# Patient Record
Sex: Female | Born: 1940 | Race: White | Hispanic: No | Marital: Married | Smoking: Former smoker
Health system: Southern US, Community
[De-identification: ages and names within clinical notes are randomized; demographics above are authoritative.]

## PROBLEM LIST (undated history)

## (undated) DIAGNOSIS — I4819 Other persistent atrial fibrillation: Secondary | ICD-10-CM

## (undated) DIAGNOSIS — I517 Cardiomegaly: Secondary | ICD-10-CM

## (undated) DIAGNOSIS — H9190 Unspecified hearing loss, unspecified ear: Secondary | ICD-10-CM

## (undated) DIAGNOSIS — L4050 Arthropathic psoriasis, unspecified: Secondary | ICD-10-CM

## (undated) DIAGNOSIS — I071 Rheumatic tricuspid insufficiency: Secondary | ICD-10-CM

## (undated) DIAGNOSIS — I340 Nonrheumatic mitral (valve) insufficiency: Secondary | ICD-10-CM

## (undated) DIAGNOSIS — M1990 Unspecified osteoarthritis, unspecified site: Secondary | ICD-10-CM

## (undated) DIAGNOSIS — L409 Psoriasis, unspecified: Secondary | ICD-10-CM

## (undated) DIAGNOSIS — G2581 Restless legs syndrome: Secondary | ICD-10-CM

## (undated) DIAGNOSIS — K219 Gastro-esophageal reflux disease without esophagitis: Secondary | ICD-10-CM

## (undated) HISTORY — DX: Psoriasis, unspecified: L40.9

## (undated) HISTORY — PX: TUBAL LIGATION: SHX77

## (undated) HISTORY — DX: Nonrheumatic mitral (valve) insufficiency: I34.0

## (undated) HISTORY — PX: EYE SURGERY: SHX253

## (undated) HISTORY — PX: OTHER SURGICAL HISTORY: SHX169

## (undated) HISTORY — DX: Cardiomegaly: I51.7

## (undated) HISTORY — DX: Other persistent atrial fibrillation: I48.19

## (undated) HISTORY — DX: Rheumatic tricuspid insufficiency: I07.1

## (undated) HISTORY — PX: CYSTOSCOPY: SUR368

---

## 2008-05-07 ENCOUNTER — Ambulatory Visit: Payer: Self-pay | Admitting: Family Medicine

## 2010-12-21 ENCOUNTER — Inpatient Hospital Stay (HOSPITAL_COMMUNITY)
Admission: EM | Admit: 2010-12-21 | Discharge: 2010-12-29 | DRG: 372 | Disposition: A | Discharge status: Home / Self Care | Payer: Medicare Other | Attending: Internal Medicine | Admitting: Internal Medicine

## 2010-12-21 ENCOUNTER — Emergency Department (HOSPITAL_COMMUNITY): Payer: Medicare Other

## 2010-12-21 DIAGNOSIS — K219 Gastro-esophageal reflux disease without esophagitis: Secondary | ICD-10-CM | POA: Diagnosis present

## 2010-12-21 DIAGNOSIS — A498 Other bacterial infections of unspecified site: Secondary | ICD-10-CM | POA: Diagnosis present

## 2010-12-21 DIAGNOSIS — L4050 Arthropathic psoriasis, unspecified: Secondary | ICD-10-CM | POA: Diagnosis present

## 2010-12-21 DIAGNOSIS — B961 Klebsiella pneumoniae [K. pneumoniae] as the cause of diseases classified elsewhere: Secondary | ICD-10-CM | POA: Diagnosis present

## 2010-12-21 DIAGNOSIS — N390 Urinary tract infection, site not specified: Secondary | ICD-10-CM | POA: Diagnosis present

## 2010-12-21 DIAGNOSIS — K589 Irritable bowel syndrome without diarrhea: Secondary | ICD-10-CM | POA: Diagnosis present

## 2010-12-21 DIAGNOSIS — E860 Dehydration: Secondary | ICD-10-CM | POA: Diagnosis present

## 2010-12-21 DIAGNOSIS — Z87891 Personal history of nicotine dependence: Secondary | ICD-10-CM

## 2010-12-21 DIAGNOSIS — A0472 Enterocolitis due to Clostridium difficile, not specified as recurrent: Principal | ICD-10-CM | POA: Diagnosis present

## 2010-12-21 DIAGNOSIS — K5100 Ulcerative (chronic) pancolitis without complications: Secondary | ICD-10-CM | POA: Diagnosis present

## 2010-12-21 DIAGNOSIS — G2581 Restless legs syndrome: Secondary | ICD-10-CM | POA: Diagnosis present

## 2010-12-21 LAB — URINALYSIS, ROUTINE W REFLEX MICROSCOPIC
Nitrite: NEGATIVE
Specific Gravity, Urine: 1.029 (ref 1.005–1.030)
pH: 6 (ref 5.0–8.0)

## 2010-12-21 LAB — COMPREHENSIVE METABOLIC PANEL
ALT: 16 U/L (ref 0–35)
AST: 21 U/L (ref 0–37)
Calcium: 8.7 mg/dL (ref 8.4–10.5)
GFR calc Af Amer: >60 mL/min (ref >60)
Glucose, Bld: 129 mg/dL — ABNORMAL HIGH (ref 70–99)
Sodium: 135 mEq/L (ref 135–145)
Total Protein: 6 g/dL (ref 6.0–8.3)

## 2010-12-21 LAB — LIPASE, BLOOD: Lipase: 19 U/L (ref 11–59)

## 2010-12-21 LAB — URINE MICROSCOPIC-ADD ON

## 2010-12-21 LAB — CBC
MCHC: 36.1 g/dL — ABNORMAL HIGH (ref 30.0–36.0)
RDW: 12.5 % (ref 11.5–15.5)

## 2010-12-21 LAB — DIFFERENTIAL
Basophils Absolute: 0 10*3/uL (ref 0.0–0.1)
Basophils Relative: 0 % (ref 0–1)
Eosinophils Absolute: 0 10*3/uL (ref 0.0–0.7)
Eosinophils Relative: 0 % (ref 0–5)
Monocytes Absolute: 1.7 10*3/uL — ABNORMAL HIGH (ref 0.1–1.0)

## 2010-12-21 MED ORDER — IOHEXOL 300 MG/ML  SOLN: 80.0000 mL | Freq: Once | INTRAMUSCULAR | Status: AC | PRN

## 2010-12-22 LAB — BASIC METABOLIC PANEL
BUN: 6 mg/dL (ref 6–23)
Calcium: 7.5 mg/dL — ABNORMAL LOW (ref 8.4–10.5)
Creatinine, Ser: 0.83 mg/dL (ref 0.4–1.2)
GFR calc non Af Amer: >60 mL/min (ref >60)
Potassium: 3.5 mEq/L (ref 3.5–5.1)

## 2010-12-22 LAB — CBC
MCH: 32 pg (ref 26.0–34.0)
MCHC: 35.1 g/dL (ref 30.0–36.0)
MCV: 91.3 fL (ref 78.0–100.0)
Platelets: 159 10*3/uL (ref 150–400)
RDW: 12.5 % (ref 11.5–15.5)
WBC: 13.2 10*3/uL — ABNORMAL HIGH (ref 4.0–10.5)

## 2010-12-22 LAB — GIARDIA/CRYPTOSPORIDIUM SCREEN(EIA)
Cryptosporidium Screen (EIA): NEGATIVE
Giardia Screen - EIA: NEGATIVE

## 2010-12-22 LAB — CLOSTRIDIUM DIFFICILE BY PCR

## 2010-12-23 LAB — CBC
MCHC: 35.7 g/dL (ref 30.0–36.0)
MCV: 91.4 fL (ref 78.0–100.0)
Platelets: 184 10*3/uL (ref 150–400)
RDW: 12.6 % (ref 11.5–15.5)
WBC: 15.6 10*3/uL — ABNORMAL HIGH (ref 4.0–10.5)

## 2010-12-23 LAB — BASIC METABOLIC PANEL
BUN: 6 mg/dL (ref 6–23)
Creatinine, Ser: 0.78 mg/dL (ref 0.4–1.2)
GFR calc non Af Amer: >60 mL/min (ref >60)
Glucose, Bld: 100 mg/dL — ABNORMAL HIGH (ref 70–99)

## 2010-12-24 LAB — CBC
Hemoglobin: 12 g/dL (ref 12.0–15.0)
MCH: 31.7 pg (ref 26.0–34.0)
MCHC: 34.7 g/dL (ref 30.0–36.0)
MCV: 91.5 fL (ref 78.0–100.0)
RBC: 3.78 MIL/uL — ABNORMAL LOW (ref 3.87–5.11)

## 2010-12-24 LAB — URINE CULTURE

## 2010-12-25 ENCOUNTER — Inpatient Hospital Stay (HOSPITAL_COMMUNITY): Payer: Medicare Other

## 2010-12-25 LAB — DIFFERENTIAL
Basophils Absolute: 0 10*3/uL (ref 0.0–0.1)
Basophils Relative: 0 % (ref 0–1)
Eosinophils Absolute: 0.1 10*3/uL (ref 0.0–0.7)
Eosinophils Relative: 1 % (ref 0–5)
Monocytes Absolute: 1.3 10*3/uL — ABNORMAL HIGH (ref 0.1–1.0)
Monocytes Relative: 10 % (ref 3–12)
Neutro Abs: 10.5 10*3/uL — ABNORMAL HIGH (ref 1.7–7.7)

## 2010-12-25 LAB — CBC
MCH: 31.5 pg (ref 26.0–34.0)
MCHC: 34.4 g/dL (ref 30.0–36.0)
Platelets: 223 10*3/uL (ref 150–400)
RDW: 12.6 % (ref 11.5–15.5)

## 2010-12-25 LAB — STOOL CULTURE

## 2010-12-25 LAB — BASIC METABOLIC PANEL
BUN: 7 mg/dL (ref 6–23)
CO2: 26 mEq/L (ref 19–32)
Calcium: 7.3 mg/dL — ABNORMAL LOW (ref 8.4–10.5)
Chloride: 109 mEq/L (ref 96–112)
Creatinine, Ser: 0.81 mg/dL (ref 0.4–1.2)
GFR calc Af Amer: >60 mL/min (ref >60)

## 2010-12-26 DIAGNOSIS — R933 Abnormal findings on diagnostic imaging of other parts of digestive tract: Secondary | ICD-10-CM

## 2010-12-26 DIAGNOSIS — R197 Diarrhea, unspecified: Secondary | ICD-10-CM

## 2010-12-26 DIAGNOSIS — A0472 Enterocolitis due to Clostridium difficile, not specified as recurrent: Secondary | ICD-10-CM

## 2010-12-26 LAB — BASIC METABOLIC PANEL
BUN: 8 mg/dL (ref 6–23)
Chloride: 111 mEq/L (ref 96–112)
GFR calc Af Amer: >60 mL/min (ref >60)
GFR calc non Af Amer: >60 mL/min (ref >60)
Potassium: 3.2 mEq/L — ABNORMAL LOW (ref 3.5–5.1)
Sodium: 138 mEq/L (ref 135–145)

## 2010-12-26 LAB — CBC
MCV: 92 fL (ref 78.0–100.0)
Platelets: 210 10*3/uL (ref 150–400)
RBC: 3.63 MIL/uL — ABNORMAL LOW (ref 3.87–5.11)
RDW: 12.7 % (ref 11.5–15.5)
WBC: 7.2 10*3/uL (ref 4.0–10.5)

## 2010-12-27 DIAGNOSIS — A0472 Enterocolitis due to Clostridium difficile, not specified as recurrent: Secondary | ICD-10-CM

## 2010-12-27 DIAGNOSIS — R197 Diarrhea, unspecified: Secondary | ICD-10-CM

## 2010-12-27 DIAGNOSIS — R933 Abnormal findings on diagnostic imaging of other parts of digestive tract: Secondary | ICD-10-CM

## 2010-12-27 LAB — BASIC METABOLIC PANEL
BUN: 6 mg/dL (ref 6–23)
GFR calc Af Amer: >60 mL/min (ref >60)
GFR calc non Af Amer: >60 mL/min (ref >60)
Glucose, Bld: 91 mg/dL (ref 70–99)
Potassium: 3.9 mEq/L (ref 3.5–5.1)
Sodium: 140 mEq/L (ref 135–145)

## 2010-12-28 ENCOUNTER — Other Ambulatory Visit: Payer: Self-pay | Admitting: Gastroenterology

## 2010-12-28 DIAGNOSIS — A0472 Enterocolitis due to Clostridium difficile, not specified as recurrent: Secondary | ICD-10-CM

## 2010-12-28 DIAGNOSIS — R933 Abnormal findings on diagnostic imaging of other parts of digestive tract: Secondary | ICD-10-CM

## 2010-12-28 LAB — DIFFERENTIAL
Basophils Absolute: 0 K/uL (ref 0.0–0.1)
Basophils Relative: 0 % (ref 0–1)
Eosinophils Absolute: 0.1 10*3/uL (ref 0.0–0.7)
Eosinophils Relative: 2 % (ref 0–5)
Lymphocytes Relative: 17 % (ref 12–46)
Lymphs Abs: 1 10*3/uL (ref 0.7–4.0)
Monocytes Absolute: 0.8 10*3/uL (ref 0.1–1.0)
Monocytes Relative: 14 % — ABNORMAL HIGH (ref 3–12)
Neutro Abs: 3.8 K/uL (ref 1.7–7.7)
Neutrophils Relative %: 67 % (ref 43–77)

## 2010-12-28 LAB — RENAL FUNCTION PANEL
Albumin: 2.3 g/dL — ABNORMAL LOW (ref 3.5–5.2)
BUN: 6 mg/dL (ref 6–23)
CO2: 29 mEq/L (ref 19–32)
Calcium: 7.8 mg/dL — ABNORMAL LOW (ref 8.4–10.5)
Chloride: 110 meq/L (ref 96–112)
Creatinine, Ser: 0.9 mg/dL (ref 0.4–1.2)
GFR calc Af Amer: >60 mL/min (ref >60)
GFR calc non Af Amer: >60 mL/min (ref >60)
Glucose, Bld: 97 mg/dL (ref 70–99)
Phosphorus: 5 mg/dL — ABNORMAL HIGH (ref 2.3–4.6)
Potassium: 3.5 mEq/L (ref 3.5–5.1)
Sodium: 145 meq/L (ref 135–145)

## 2010-12-28 LAB — CBC
HCT: 33.1 % — ABNORMAL LOW (ref 36.0–46.0)
Hemoglobin: 11.5 g/dL — ABNORMAL LOW (ref 12.0–15.0)
MCH: 31.9 pg (ref 26.0–34.0)
MCHC: 34.7 g/dL (ref 30.0–36.0)
MCV: 91.9 fL (ref 78.0–100.0)
Platelets: 224 10*3/uL (ref 150–400)
RBC: 3.6 MIL/uL — ABNORMAL LOW (ref 3.87–5.11)
RDW: 12.6 % (ref 11.5–15.5)
WBC: 5.7 10*3/uL (ref 4.0–10.5)

## 2010-12-28 NOTE — Progress Notes (Signed)
Cynthia Potts, Cynthia Potts              ACCOUNT NO.:  [id number]  MEDICAL RECORD NO.:  [id number]           PATIENT TYPE:  I  LOCATION:  6731                         FACILITY:  [facility]  PHYSICIAN:  Jeoffrey Massed, MD    DATE OF BIRTH:  [birth year]                                PROGRESS NOTE   PRIMARY CARE PRACTITIONER: Nilda Simmer, MD  CURRENT MEDICAL ISSUES: Include; 1. Pancolitis secondary to Clostridium difficile with very slow     improvement. 2. Leukocytosis secondary to above, now resolved. 3. Urine culture positive, likely a contaminant and not on any     antibiotics for this. 4. History of gastroesophageal reflux disease. 5. Restless legs syndrome. 6. History of irritable bowel syndrome.  CONSULTANTS ON THE CASE: Dr. Christella Hartigan from [facility].  BRIEF HISTORY OF PRESENT ILLNESS: The patient is a 70 year old female who has a past medical history of irritable bowel syndrome and gastroesophageal reflux disease on chronic proton pump inhibitor well at home came in on the December 21, 2010, with loose watery stools and some crampy abdominal pain.  The CT of the abdomen done in the ED showed pancolitis.  She was then admitted to the hospitalist service for the treatment and evaluation.  PERTINENT LABORATORY STUDIES: 1. Stool C. diff PCR done on the December 21, 2010 is positive. 2. Stool for Giardia and Cryptosporidium are negative. 3. Stool cultures are negative so far. 4. Urine culture positive for E. coli and Klebsiella. 5. CBC on admission showed WBC of 15.1. 6. CBC done today showed WBC of 7.2.  PERTINENT RADIOLOGICAL STUDIES: 1. CT of the abdomen and pelvis showed pancolitis with no definite     vascular cause. 2. X-ray of the abdomen done on December 25 1010, showed no acute     findings.  BRIEF HOSPITAL COURSE: So far; 1. Pancolitis.  At this point, this is thought to be secondary to C.     difficile colitis, although the patient does not have a prior  history of any recent antibiotic use or a recent hospitalization.     She, however, does take chronic proton pump inhibitors.  She has     been on vancomycin since the day of admission and today is day 5.     She is also on Florastor and has been started on Questran as well.     With this regimen, the patient has had minimal relief although     leukocytosis is resolved.  However, she continues to have around 6-     8 loose watery stools daily.  Because of her very slow improvement     and pancolitis, a GI consultation has been sought today for     possible flexible sigmoidoscopy to make sure that we are in fact     dealing with C. diff and not something else.  In the meantime, the     patient does not look toxic.  She is ambulatory apart from the     diarrhea.  There are no other issues that are active.  Plan at this     point is to await GI evaluation and  follow their recommendations     for the patient being discharged home. 2. Leukocytosis, this was secondary to colitis and has now resolved. 3. Positive urine cultures.  The patient has no signs or symptoms     compatible with urinary tract infection.  All of her problems are     consistent with colitis and diarrhea, so at this point this is     thought to be more of a contamination rather than true infection     and this is not being treated with antibiotics, this especially     important as she has ongoing colitis. 4. Restless legs syndrome.  The patient is on Requip. 5. Disposition.  Because of her ongoing diarrhea, it is thought that     the patient is still not stable for discharge as she is high risk     to develop dehydration when she goes home. 6. We are currently awaiting GI evaluation for possible flexible     sigmoidoscopy if this patient's condition continues with very     minimal improvement.     Jeoffrey Massed, MD     SG/MEDQ  D:  12/26/2010  T:  12/26/2010  Job:  [id number]  cc:   Nilda Simmer, M.D. Fax:  [phone]  Electronically Signed by Jeoffrey Massed  on 12/28/2010 08:55:53 PM

## 2010-12-29 LAB — RENAL FUNCTION PANEL
BUN: 5 mg/dL — ABNORMAL LOW (ref 6–23)
CO2: 29 mEq/L (ref 19–32)
Calcium: 8.1 mg/dL — ABNORMAL LOW (ref 8.4–10.5)
Chloride: 107 mEq/L (ref 96–112)
Creatinine, Ser: 0.83 mg/dL (ref 0.4–1.2)
Glucose, Bld: 99 mg/dL (ref 70–99)

## 2010-12-29 LAB — DIFFERENTIAL
Basophils Relative: 0 % (ref 0–1)
Lymphocytes Relative: 15 % (ref 12–46)
Lymphs Abs: 0.8 10*3/uL (ref 0.7–4.0)
Monocytes Absolute: 0.6 10*3/uL (ref 0.1–1.0)
Monocytes Relative: 10 % (ref 3–12)
Neutro Abs: 4.3 10*3/uL (ref 1.7–7.7)

## 2010-12-29 LAB — CBC
HCT: 36.3 % (ref 36.0–46.0)
Hemoglobin: 12.6 g/dL (ref 12.0–15.0)
MCH: 31.7 pg (ref 26.0–34.0)
MCHC: 34.7 g/dL (ref 30.0–36.0)
MCV: 91.2 fL (ref 78.0–100.0)
RBC: 3.98 MIL/uL (ref 3.87–5.11)

## 2011-01-02 NOTE — Discharge Summary (Signed)
Cynthia Potts, Cynthia Potts              ACCOUNT NO.:  [id number]  MEDICAL RECORD NO.:  [id number]           PATIENT TYPE:  I  LOCATION:  6731                         FACILITY:  [facility]  PHYSICIAN:  Mauro Kaufmann, MD         DATE OF BIRTH:  1941-04-28  DATE OF ADMISSION:  12/21/2010 DATE OF DISCHARGE:  12/29/2010                              DISCHARGE SUMMARY   ADMISSION DIAGNOSES: 1. Pancolitis. 2. Leukocytosis. 3. Restless legs syndrome. 4. Psoriatic arthritis.  DISCHARGE DIAGNOSES: 1. Pancolitis due to the clostridium difficile, improved. 2. Leukocytosis, resolved. 3. Urine culture positive for Klebsiella and Escherichia coli, not     fixed in the hospital because of the severity of clostridium     difficile colitis likely thought to be a contamination. 4. Restless legs syndrome. 5. History of gastroesophageal reflux disease. 6. History of irritable bowel syndrome.  Consult in the hospital include Dr. Christella Hartigan from [facility].  BRIEF HISTORY AND PHYSICAL:  A 70 year old female admitted with diagnosis of diarrhea on December 21, 2010.  The patient has a history of IBS and GERD.  CT of the on the abdomen in the ED showed pancolitis. The patient was admitted for further treatment.  PERTINENT LABS DATA: 1. Stool for C. difficile done on December 21, 2010 positive. 2. Stool for Giardia and Cryptosporidium negative. 3. Stool culture negative. 4. Urine culture positive for E. coli and Klebsiella. 5. Last CBC done as of today is 5.8.  PERTINENT IMAGING STUDIES:  CT of the abdomen and pelvis showed pancolitis with no definite vascular disease.  X-ray of abdomen on December 25, 2010 showed no acute finding.  BRIEF HOSPITAL COURSE: 1. Pancolitis.  The patient was started on an antibiotics.  For the     pancolitis, she was started on vancomycin as well as on IV Flagyl.     The patient has also started on Florastor and also was on Questran.     The patient was having six to eight  loose watery stools daily in     the hospital.  Because of very slow movements, flexible     sigmoidoscopy was done as per GI, which showed pseudomembranous     colitis.  At this time, the patient IV Flagyl has been discontinued     and GI has recommended to discharge the patient home on p.o.     vancomycin for 10 more days and then she can follow up with Dr.     Christella Hartigan on Feb 07, 2011. 2. Questionable urinary tract infection.  The patient had a     questionable urinary tract infection, but urine culture positive     for E. coli and Klebsiella.  The patient has been has hesitant to     take an antibiotics and she says she is asymptomatic.  She has been     afebrile and also the white count has been normal.  Both the E.     coli and Klebsiella are sensitive to the Cipro, but the patient at     this time does not want to take any antibiotics.  I have  recommended her to repeat urine culture after 10 days of vancomycin     and if still this is growing bacteriuria, then she should be     treated for that.  The patient understands that she needs further     evaluation of the asymptomatic bacteriuria and she will follow up     with her primary care physician and I have also told her to     consider a Urology consult if the patient continues to have     persistent bacteriuria. 3. Restless legs syndrome.  The patient will continue to use ReQuip.  FOLLOWUP:  The patient will follow up with her primary care physician in [place], Dr. Antony Blackbird.  MEDICATIONS ON DISCHARGE: 1. Tylenol 650 q.4 h. p.r.n. 2. Loperamide 2 mg p.o. twice a day. 3. Florastor 500 mg p.o. b.i.d. 4. Vancomycin 125 mg p.o. four times a day. 5. Flonase one to two sprays nasally daily as needed. 6. Prilosec 20 mg one tablet p.o. daily. 7. ReQuip one tablet p.o. every evening. 8. Vitamin D3 vitamin 100 units one tablet p.o. daily. 9. Cipro 500 p.o. b.i.d. for 7 days.  The patient can take this     antibiotics  if she develops symptoms of UTI.  At this time, the     patient is hesitant to take the antibiotics for the reason that she     has severe pancolitis.     Mauro Kaufmann, MD     GL/MEDQ  D:  12/29/2010  T:  12/30/2010  Job:  [id number]  cc:   Rachael Fee, MD Nilda Simmer, M.D.  Electronically Signed by Mauro Kaufmann  on 01/02/2011 10:02:49 AM

## 2011-01-04 NOTE — Procedures (Signed)
Summary: Flexible Sigmoidoscopy  Patient: Cynthia Potts Note: All result statuses are Final unless otherwise noted.  Tests: (1) Flexible Sigmoidoscopy (FLX)  FLX Flexible Sigmoidoscopy                             DONE     [facility]     [address]     [place], [place]  [address]          FLEXIBLE SIGMOIDOSCOPY PROCEDURE REPORT          PATIENT:  Cynthia, Potts  MR#:  [id number]     BIRTHDATE:  12-26-1940, 69 yrs. old  GENDER:  female     ENDOSCOPIST:  Rachael Fee, MD     PROCEDURE DATE:  12/28/2010     PROCEDURE:  Flexible Sigmoidoscopy with biopsy     ASA CLASS:  Class II     INDICATIONS:  C. diff (PCR) positive diarrhea, pan colitis on CT;     not responding well to usual C. diff meds     MEDICATIONS:   Fentanyl 60 mcg IV, Versed 5 mg IV          DESCRIPTION OF PROCEDURE:   After the risks benefits and     alternatives of the procedure were thoroughly explained, informed     consent was obtained.  Digital rectal exam was performed and     revealed no rectal masses.   The EC-[id number] ([id number]) endoscope was     introduced through the anus and advanced to the descending colon,     without limitations.  The quality of the prep was adequate.  The     instrument was then slowly withdrawn as the mucosa was fully     examined.     <<PROCEDUREIMAGES>>     here were innumerable, whitish exudate patches. Very typical     appearing for C. difficile colitis. Briopsies taken and sent to     pathology (see image001, image002, and image003).   Retroflexed     views in the rectum revealed not performed.    The scope was then     withdrawn from the patient and the procedure terminated.     COMPLICATIONS:  None          ENDOSCOPIC IMPRESSION:     1) Pseudomembranous colitis, typcial appearing picture for C.     difficile     2)  No cancers, polyps          RECOMMENDATIONS:     Continue PO vancomycin 125 QID.     Continue IV flagyl.     Will start  twice daily scheduled immodium          ______________________________     Rachael Fee, MD          n.     eSIGNED:   Rachael Fee at 12/28/2010 01:28 PM          Lavonda Jumbo, [id number]  Note: An exclamation mark (!) indicates a result that was not dispersed into the flowsheet. Document Creation Date: 12/28/2010 1:41 PM _______________________________________________________________________  (1) Order result status: Final Collection or observation date-time: 12/28/2010 13:19 Requested date-time:  Receipt date-time:  Reported date-time:  Referring Physician:   Ordering Physician: Rob Bunting [phone]) Specimen Source:  Source: Launa Grill Order Number: [id number] Lab site:

## 2011-01-11 ENCOUNTER — Telehealth: Payer: Self-pay | Admitting: Gastroenterology

## 2011-01-11 DIAGNOSIS — R197 Diarrhea, unspecified: Secondary | ICD-10-CM

## 2011-01-11 NOTE — Telephone Encounter (Signed)
Ok to fpr pcr cdiff

## 2011-01-11 NOTE — Telephone Encounter (Signed)
Notified pt Dr Jarold Motto ok'd her for a stool sample for CDIFF by PCR. Pt stated she is feeling better now and thinks she's ok. She reports the diarrhea and cramping has stopped.

## 2011-01-11 NOTE — Telephone Encounter (Signed)
Dr Christella Hartigan did consult on pt for 12/20/10 admission for Pancolitis and + for CDIFF. Pt had IV Vanc, Flagyl, and Florastor in hospital. Pt dicharged home on po Vanc and Cipro x 7 days for UTI and Imodium which she finished Sunday, January 14, 2011. Pt reports she normally has to take an ExLax prn d/t constipation and usually has 2 "normal" stools. Yesterday she took the ExLax and woke up this am with cramping, and churning and had 4 stools before 9am. NO water in stools, just loose. Pt is so afraid her CDIFF is coming back. Is it ok to order a CDIFF for her or tell her to just wait? Thanks.

## 2011-01-14 NOTE — H&P (Signed)
Cynthia Potts, Cynthia Potts              ACCOUNT NO.:  [id number]  MEDICAL RECORD NO.:  [id number]           PATIENT TYPE:  E  LOCATION:  [facility]                         FACILITY:  [facility]  PHYSICIAN:  Calvert Cantor, M.D.     DATE OF BIRTH:  May 13, 1941  DATE OF ADMISSION:  12/21/2010 DATE OF DISCHARGE:                             HISTORY & PHYSICAL   PRIMARY CARE PHYSICIAN:  Nilda Simmer, MD at [facility].  PRESENTING COMPLAINT:  Diarrhea.  HISTORY OF PRESENT ILLNESS:  This is a 70 year old female with history of restless legs syndrome, who comes in with a complaint of diarrhea, which has been going on for about 5-6 days.  She states that stool is watery, light brown in color.  She has not noticed any blood in the stool.  She did have one episode of vomiting yesterday other than that she has had poor p.o. intake.  When asked about abdominal pain, she states initially it was lower abdominal pain, but now goes up to theupper abdomen as well.  She has crampy pain, but also has a constant ache.  She has not noticed any fevers, but did feel cold the other day. The patient started Lomotil per doctor's request yesterday, but states that stool frequency has not changed.  PAST MEDICAL HISTORY:  Restless legs syndrome and psoriatic arthritis, which she states is in remission.  PAST SURGICAL HISTORY:  None.  SOCIAL HISTORY:  She smoked, but quit in 1980s.  She smoked for about 20- 25 years, two packs per day.  Drinks alcohol occasionally.  Lives alone.  ALLERGIES:  No known drug allergies.  MEDICATIONS:  ReQuip 1 tablet daily at bedtime.  She does not know the exact dose.  She has also been taking Lomotil.  REVIEW OF SYSTEMS:  No recent weight loss or weight gain.  No fevers, but has felt cold.  She is fatigued.  She has had a poor p.o. intake and decreased urine output.  HEENT:  No blurred vision, double vision, sore throat, sinus trouble, or earache.  RESPIRATORY:  No shortness  of breath or cough.  No wheezing.  CARDIAC:  No chest pain, palpitations, or pedal edema.  GI:  Per HPI.  The patient does not have frequent episodes of diarrhea.  She does not have any ulcers in her stomach or reflux.  GU: Decreased urine output, otherwise no dysuria or hematuria. HEMATOLOGICALLY:  Bruises easily.  SKIN:  No rash.  MUSCULOSKELETAL:  No joint pain or back pain.  NEUROLOGICALLY:  No history of stroke or seizure.  PSYCHOLOGICALLY:  No anxiety or depression.  PHYSICAL EXAM:  GENERAL:  Elderly female sitting up in bed, in no acute distress. VITAL SIGNS:  Blood pressure 119/63, pulse 109, respiratory rate 16, temperature 97.8, oxygen saturation 98% on room air. HEENT:  Pupils equal, round, and reactive to light.  Extraocular movements are intact.  Conjunctivae is pink.  No scleral icterus.  Oral mucosa is dry.  Oropharynx clear. NECK:  Supple.  No thyromegaly, lymphadenopathy, or carotid bruits. HEART:  Regular rate and rhythm.  No murmurs, rubs, or gallops. Tachycardic. LUNGS: Clear bilaterally.  Normal respiratory effort.  No use of accessory muscles. ABDOMEN:  Soft, diffusely tender.  Bowel sounds positive.  No rebound tenderness, nondistended. EXTREMITIES:  No cyanosis, clubbing, or edema.  Pedal pulses positive. NEUROLOGICALLY:  Cranial nerves II through XII intact.  Strength intact in all four extremities. PSYCHOLOGICALLY:  Awake, alert, oriented x3.  Mood and affect normal. SKIN:  Warm, dry.  No rash or bruising.  LABORATORY DATA:  Blood work, WBC count is elevated at 15.1.  Rest of her CBC is normal.  Metabolic panel is normal including BUN of 30 and creatinine of 0.94, total bilirubin is elevated at 1.6.  UA reveals a small amount of bilirubin, small amount of leukocyte esterase, 3-6 wbc's, and few bacteria.  CT of the abdomen and pelvis with contrast reveals diffuse colonic mucosal edema present throughout the entire colon consistent with pancolitis.  Small  amount of fluid is present in the peritoneal cavity. No definite vascular causes seen.  Uterus is normal in size with a small calcified uterine fibroid present.  ASSESSMENT/PLAN: 1. Pancolitis.  We will start Cipro and Flagyl.  I have requested     stool be sent for Clostridium difficile and culture.  The patient     states that she has not recently been on antibiotics.  She will be     on clear liquids for now.  She can have Vicodin if her pain is     severe and Zofran for nausea or vomiting. 2. Leukocytosis due to pancolitis. 3. Dehydration.  After 1 L bolus of normal saline, she has urinated a     little.  We will continue normal saline at 125 mL an hour. 4. Restless legs syndrome.  Continue ReQuip. 5. Psoriatic arthritis in remission.  The patient would like to be a full code.  Deep venous thrombosis prophylaxis with Lovenox.  Time on admission was 45 minutes.     Calvert Cantor, M.D.     SR/MEDQ  D:  12/21/2010  T:  12/21/2010  Job:  [id number]  cc:   Nilda Simmer, M.D.  Electronically Signed by Calvert Cantor M.D. on 01/14/2011 03:50:50 PM

## 2011-01-23 ENCOUNTER — Telehealth: Payer: Self-pay | Admitting: Gastroenterology

## 2011-01-23 NOTE — Telephone Encounter (Signed)
Pt has only mild nausea and she has only had 1 episode of diarrhea today.  She has been around sick family members with a stomach virus.  She wanted to see if Dr Christella Hartigan thought she needed to move her appt up sooner than 02/07/11.  She was seen 12/30/10 at [facility] for Cdiff and she is afraid to get sick like that again.  I did advise the pt to stay on clear liquids until she has no nausea and she has taken  Imodium this afternoon and it has helped.  Please advise

## 2011-01-23 NOTE — Telephone Encounter (Signed)
I agree, slowly increase diet as nausea improves.  If diarrhea becomes more persistent she needs to call back, would restart Abx.

## 2011-02-07 ENCOUNTER — Ambulatory Visit: Payer: Medicare Other | Admitting: Gastroenterology

## 2012-02-05 ENCOUNTER — Ambulatory Visit: Payer: Self-pay | Admitting: Internal Medicine

## 2012-12-02 ENCOUNTER — Ambulatory Visit: Payer: Self-pay | Admitting: Internal Medicine

## 2013-01-05 ENCOUNTER — Ambulatory Visit: Payer: Self-pay | Admitting: Internal Medicine

## 2013-02-26 ENCOUNTER — Ambulatory Visit: Payer: Self-pay | Admitting: Gastroenterology

## 2014-02-24 DIAGNOSIS — R002 Palpitations: Secondary | ICD-10-CM | POA: Insufficient documentation

## 2015-02-05 ENCOUNTER — Emergency Department: Admit: 2015-02-05 | Disposition: A | Discharge status: Home / Self Care | Payer: Self-pay | Admitting: Student

## 2015-02-05 LAB — COMPREHENSIVE METABOLIC PANEL
ALBUMIN: 4.5 g/dL
AST: 23 U/L
Alkaline Phosphatase: 76 U/L
Anion Gap: 8 (ref 7–16)
BUN: 21 mg/dL — ABNORMAL HIGH
Bilirubin,Total: 2.1 mg/dL — ABNORMAL HIGH
CALCIUM: 9.1 mg/dL
CHLORIDE: 102 mmol/L
CO2: 27 mmol/L
Creatinine: 0.97 mg/dL
EGFR (Non-African Amer.): 59 — ABNORMAL LOW
Glucose: 106 mg/dL — ABNORMAL HIGH
Potassium: 3.5 mmol/L
SGPT (ALT): 21 U/L
Sodium: 137 mmol/L
Total Protein: 7.7 g/dL

## 2015-02-05 LAB — CK TOTAL AND CKMB (NOT AT ARMC)
CK, Total: 44 U/L
CK-MB: 0.9 ng/mL

## 2015-02-05 LAB — CBC
HCT: 39.8 % (ref 35.0–47.0)
HGB: 13.2 g/dL (ref 12.0–16.0)
MCH: 31.8 pg (ref 26.0–34.0)
MCHC: 33.1 g/dL (ref 32.0–36.0)
MCV: 96 fL (ref 80–100)
PLATELETS: 161 10*3/uL (ref 150–440)
RBC: 4.15 10*6/uL (ref 3.80–5.20)
RDW: 13.3 % (ref 11.5–14.5)
WBC: 7.1 10*3/uL (ref 3.6–11.0)

## 2015-02-05 LAB — TROPONIN I

## 2015-02-05 LAB — PROTIME-INR
INR: 1.3
Prothrombin Time: 16.3 secs — ABNORMAL HIGH

## 2015-02-05 LAB — APTT: Activated PTT: 36.2 secs — ABNORMAL HIGH (ref 23.6–35.9)

## 2015-02-05 MED ORDER — PROPOFOL 1000 MG/100ML IV EMUL: 10.0000 ug/kg/min | INTRAVENOUS | Status: DC

## 2015-02-11 DIAGNOSIS — I340 Nonrheumatic mitral (valve) insufficiency: Secondary | ICD-10-CM | POA: Insufficient documentation

## 2015-02-28 ENCOUNTER — Ambulatory Visit
Admission: RE | Admit: 2015-02-28 | Discharge: 2015-02-28 | Disposition: A | Discharge status: Home / Self Care | Payer: Medicare HMO | Source: Ambulatory Visit | Attending: Internal Medicine | Admitting: Internal Medicine

## 2015-02-28 ENCOUNTER — Ambulatory Visit: Payer: Medicare HMO | Admitting: Anesthesiology

## 2015-02-28 ENCOUNTER — Encounter: Payer: Self-pay | Admitting: *Deleted

## 2015-02-28 ENCOUNTER — Encounter
Admission: RE | Disposition: A | Discharge status: Home / Self Care | Payer: Self-pay | Source: Ambulatory Visit | Attending: Internal Medicine

## 2015-02-28 DIAGNOSIS — R002 Palpitations: Secondary | ICD-10-CM | POA: Insufficient documentation

## 2015-02-28 DIAGNOSIS — I480 Paroxysmal atrial fibrillation: Secondary | ICD-10-CM | POA: Insufficient documentation

## 2015-02-28 DIAGNOSIS — Z79899 Other long term (current) drug therapy: Secondary | ICD-10-CM | POA: Diagnosis not present

## 2015-02-28 DIAGNOSIS — I340 Nonrheumatic mitral (valve) insufficiency: Secondary | ICD-10-CM | POA: Diagnosis not present

## 2015-02-28 DIAGNOSIS — Z7901 Long term (current) use of anticoagulants: Secondary | ICD-10-CM | POA: Insufficient documentation

## 2015-02-28 DIAGNOSIS — R0600 Dyspnea, unspecified: Secondary | ICD-10-CM | POA: Diagnosis not present

## 2015-02-28 DIAGNOSIS — I071 Rheumatic tricuspid insufficiency: Secondary | ICD-10-CM | POA: Diagnosis not present

## 2015-02-28 HISTORY — DX: Gastro-esophageal reflux disease without esophagitis: K21.9

## 2015-02-28 HISTORY — PX: ELECTROPHYSIOLOGIC STUDY: SHX172A

## 2015-02-28 SURGERY — CARDIOVERSION (CATH LAB)
Anesthesia: General

## 2015-02-28 MED ORDER — PROPOFOL 10 MG/ML IV BOLUS
INTRAVENOUS | Status: DC | PRN
  Administered 2015-02-28: 10 mg via INTRAVENOUS
  Administered 2015-02-28 (×2): 20 mg via INTRAVENOUS
  Administered 2015-02-28: 35 mg via INTRAVENOUS
  Administered 2015-02-28: 20 mg via INTRAVENOUS
  Administered 2015-02-28: 10 mg via INTRAVENOUS

## 2015-02-28 MED ORDER — SODIUM CHLORIDE 0.9 % IV SOLN
250.0000 mL | INTRAVENOUS | Status: DC
  Administered 2015-02-28: 07:00:00 via INTRAVENOUS

## 2015-02-28 MED ORDER — SODIUM CHLORIDE 0.9 % IV SOLN
INTRAVENOUS | Status: DC
  Administered 2015-02-28: 07:00:00 via INTRAVENOUS

## 2015-02-28 NOTE — CV Procedure (Signed)
Electrical Cardioversion Procedure Note Ketty Bitton [id number] [birth year]  Procedure: Electrical Cardioversion Indications:  Atrial Fibrillation  Procedure Details Consent: Risks of procedure as well as the alternatives and risks of each were explained to the (patient/caregiver).  Consent for procedure obtained. Time Out: Verified patient identification, verified procedure, site/side was marked, verified correct patient position, special equipment/implants available, medications/allergies/relevent history reviewed, required imaging and test results available.  Performed  Patient placed on cardiac monitor, pulse oximetry, supplemental oxygen as necessary.  Sedation given: Short-acting barbiturates Pacer pads placed anterior and posterior chest.  Cardioverted 3 time(s).  Cardioverted at 120J.  Evaluation Findings: Post procedure EKG shows: Atrial Fibrillation Complications: None Patient did tolerate procedure well.   Corey Skains, MD   02/28/2015, 8:19 AM

## 2015-02-28 NOTE — Anesthesia Preprocedure Evaluation (Addendum)
Anesthesia Evaluation    Airway Mallampati: II       Dental   Pulmonary former smoker,          Cardiovascular Rhythm:irregular Rate:Normal     Neuro/Psych    GI/Hepatic GERD-  ,  Endo/Other    Renal/GU      Musculoskeletal   Abdominal   Peds  Hematology   Anesthesia Other Findings   Reproductive/Obstetrics                            Anesthesia Physical Anesthesia Plan  ASA: III  Anesthesia Plan: General   Post-op Pain Management:    Induction:   Airway Management Planned:   Additional Equipment:   Intra-op Plan:   Post-operative Plan:   Informed Consent: I have reviewed the patients History and Physical, chart, labs and discussed the procedure including the risks, benefits and alternatives for the proposed anesthesia with the patient or authorized representative who has indicated his/her understanding and acceptance.     Plan Discussed with: CRNA  Anesthesia Plan Comments:         Anesthesia Quick Evaluation

## 2015-02-28 NOTE — Anesthesia Procedure Notes (Signed)
Performed by: Aline Brochure Oxygen Delivery Method: Nasal cannula

## 2015-02-28 NOTE — Anesthesia Postprocedure Evaluation (Signed)
  Anesthesia Post-op Note  Patient: Cynthia Potts  Procedure(s) Performed: Procedure(s): Cardioversion (N/A)  Anesthesia type:General  Patient location: PACU  Post pain: Pain level controlled  Post assessment: Post-op Vital signs reviewed, Patient's Cardiovascular Status Stable, Respiratory Function Stable, Patent Airway and No signs of Nausea or vomiting  Post vital signs: Reviewed and stable  Last Vitals:  Filed Vitals:   02/28/15 0829  BP:   Pulse: 51  Temp:   Resp: 18    Level of consciousness: awake, alert  and patient cooperative  Complications: No apparent anesthesia complications

## 2015-02-28 NOTE — Transfer of Care (Signed)
Immediate Anesthesia Transfer of Care Note  Patient: Cynthia Potts  Procedure(s) Performed: Procedure(s): Cardioversion (N/A)  Patient Location: PACU  Anesthesia Type:General  Level of Consciousness: awake and alert   Airway & Oxygen Therapy: Patient Spontanous Breathing and Patient connected to nasal cannula oxygen  Post-op Assessment: Report given to RN  Post vital signs: stable  Last Vitals:  Filed Vitals:   02/28/15 0740  BP: 104/75  Pulse: 71  Temp: 36.1 C  Resp: 22    Complications: No apparent anesthesia complications

## 2015-03-24 ENCOUNTER — Ambulatory Visit
Admission: RE | Admit: 2015-03-24 | Discharge: 2015-03-24 | Disposition: A | Discharge status: Home / Self Care | Payer: Medicare HMO | Source: Ambulatory Visit | Attending: Internal Medicine | Admitting: Internal Medicine

## 2015-03-24 ENCOUNTER — Ambulatory Visit: Payer: Medicare HMO | Admitting: Anesthesiology

## 2015-03-24 ENCOUNTER — Encounter: Payer: Self-pay | Admitting: Anesthesiology

## 2015-03-24 ENCOUNTER — Encounter
Admission: RE | Disposition: A | Discharge status: Home / Self Care | Payer: Self-pay | Source: Ambulatory Visit | Attending: Internal Medicine

## 2015-03-24 DIAGNOSIS — R002 Palpitations: Secondary | ICD-10-CM | POA: Insufficient documentation

## 2015-03-24 DIAGNOSIS — Z79899 Other long term (current) drug therapy: Secondary | ICD-10-CM | POA: Diagnosis not present

## 2015-03-24 DIAGNOSIS — Z7901 Long term (current) use of anticoagulants: Secondary | ICD-10-CM | POA: Insufficient documentation

## 2015-03-24 DIAGNOSIS — Z8249 Family history of ischemic heart disease and other diseases of the circulatory system: Secondary | ICD-10-CM | POA: Diagnosis not present

## 2015-03-24 DIAGNOSIS — I480 Paroxysmal atrial fibrillation: Secondary | ICD-10-CM | POA: Diagnosis not present

## 2015-03-24 DIAGNOSIS — R0602 Shortness of breath: Secondary | ICD-10-CM | POA: Insufficient documentation

## 2015-03-24 DIAGNOSIS — I4892 Unspecified atrial flutter: Secondary | ICD-10-CM | POA: Diagnosis present

## 2015-03-24 HISTORY — PX: ELECTROPHYSIOLOGIC STUDY: SHX172A

## 2015-03-24 SURGERY — CARDIOVERSION (CATH LAB)
Anesthesia: General

## 2015-03-24 MED ORDER — PROPOFOL 10 MG/ML IV BOLUS
INTRAVENOUS | Status: DC | PRN
  Administered 2015-03-24: 30 mg via INTRAVENOUS
  Administered 2015-03-24: 70 mg via INTRAVENOUS

## 2015-03-24 MED ORDER — SODIUM CHLORIDE 0.9 % IV SOLN
INTRAVENOUS | Status: DC
  Administered 2015-03-24: 07:00:00 via INTRAVENOUS

## 2015-03-24 NOTE — Anesthesia Preprocedure Evaluation (Addendum)
Anesthesia Evaluation  Patient identified by MRN, date of birth, ID band Patient awake    Reviewed: Allergy & Precautions, NPO status , Patient's Chart, lab work & pertinent test results, reviewed documented beta blocker date and time   Airway Mallampati: II       Dental   Pulmonary former smoker,          Cardiovascular + dysrhythmias Atrial Fibrillation     Neuro/Psych    GI/Hepatic GERD-  ,  Endo/Other    Renal/GU      Musculoskeletal   Abdominal   Peds  Hematology   Anesthesia Other Findings   Reproductive/Obstetrics                            Anesthesia Physical Anesthesia Plan  ASA: III  Anesthesia Plan: General   Post-op Pain Management:    Induction: Intravenous  Airway Management Planned: Nasal Cannula  Additional Equipment:   Intra-op Plan:   Post-operative Plan:   Informed Consent: I have reviewed the patients History and Physical, chart, labs and discussed the procedure including the risks, benefits and alternatives for the proposed anesthesia with the patient or authorized representative who has indicated his/her understanding and acceptance.     Plan Discussed with: CRNA  Anesthesia Plan Comments:         Anesthesia Quick Evaluation

## 2015-03-24 NOTE — CV Procedure (Signed)
Electrical Cardioversion Procedure Note Cynthia Potts [id number] 1941/05/15  Procedure: Electrical Cardioversion Indications:  Atrial Flutter  Procedure Details Consent: Risks of procedure as well as the alternatives and risks of each were explained to the (patient/caregiver).  Consent for procedure obtained. Time Out: Verified patient identification, verified procedure, site/side was marked, verified correct patient position, special equipment/implants available, medications/allergies/relevent history reviewed, required imaging and test results available.  Performed  Patient placed on cardiac monitor, pulse oximetry, supplemental oxygen as necessary.  Sedation given: Benzodiazepines Pacer pads placed anterior and posterior chest.  Cardioverted 1 time(s).  Cardioverted at 100j.  Evaluation Findings: Post procedure EKG shows: NSR Complications: None Patient did tolerate procedure well.   Corey Skains 03/24/2015, 7:45 AM

## 2015-03-24 NOTE — Discharge Instructions (Signed)

## 2015-03-24 NOTE — Anesthesia Postprocedure Evaluation (Signed)
  Anesthesia Post-op Note  Patient: Cynthia Potts  Procedure(s) Performed: Procedure(s): Cardioversion (N/A)  Anesthesia type:General  Patient location: PACU  Post pain: Pain level controlled  Post assessment: Post-op Vital signs reviewed, Patient's Cardiovascular Status Stable, Respiratory Function Stable, Patent Airway and No signs of Nausea or vomiting  Post vital signs: Reviewed and stable  Last Vitals:  Filed Vitals:   03/24/15 0755  BP: 101/59  Pulse:   Temp:   Resp: 18    Level of consciousness: awake, alert  and patient cooperative  Complications: No apparent anesthesia complications

## 2015-03-24 NOTE — Transfer of Care (Signed)
Immediate Anesthesia Transfer of Care Note  Patient: Cynthia Potts  Procedure(s) Performed: Procedure(s): Cardioversion (N/A)  Patient Location: Short Stay  Anesthesia Type:General  Level of Consciousness: awake  Airway & Oxygen Therapy: Patient Spontanous Breathing and Patient connected to nasal cannula oxygen  Post-op Assessment: Report given to RN  Post vital signs: Reviewed  Last Vitals:  Filed Vitals:   03/24/15 0755  BP: 101/59  Pulse:   Temp:   Resp: 18    Complications: No apparent anesthesia complications

## 2015-04-05 ENCOUNTER — Encounter: Payer: Self-pay | Admitting: *Deleted

## 2015-04-06 ENCOUNTER — Encounter: Payer: Self-pay | Admitting: Internal Medicine

## 2015-04-06 ENCOUNTER — Ambulatory Visit (INDEPENDENT_AMBULATORY_CARE_PROVIDER_SITE_OTHER): Payer: Medicare HMO | Admitting: Internal Medicine

## 2015-04-06 ENCOUNTER — Encounter: Payer: Self-pay | Admitting: *Deleted

## 2015-04-06 VITALS — BP 126/78 | HR 86 | Ht 63.0 in | Wt 134.2 lb

## 2015-04-06 DIAGNOSIS — I480 Paroxysmal atrial fibrillation: Secondary | ICD-10-CM | POA: Diagnosis not present

## 2015-04-06 DIAGNOSIS — I481 Persistent atrial fibrillation: Secondary | ICD-10-CM | POA: Diagnosis not present

## 2015-04-06 DIAGNOSIS — I4819 Other persistent atrial fibrillation: Secondary | ICD-10-CM | POA: Insufficient documentation

## 2015-04-06 NOTE — Patient Instructions (Addendum)
Medication Instructions:  Your physician recommends that you continue on your current medications as directed. Please refer to the Current Medication list given to you today.   Labwork: Your physician recommends that you return for lab work on 05/05/15 at 2pm   Testing/Procedures: Your physician has recommended that you have an ablation. Catheter ablation is a medical procedure used to treat some cardiac arrhythmias (irregular heartbeats). During catheter ablation, a long, thin, flexible tube is put into a blood vessel in your groin (upper thigh), or neck. This tube is called an ablation catheter. It is then guided to your heart through the blood vessel. Radio frequency waves destroy small areas of heart tissue where abnormal heartbeats may cause an arrhythmia to start. Please see the instruction sheet given to you today.    Follow-Up: Your physician recommends that you schedule a follow-up appointment in 4 weeks post ablation with Roderic Palau, NP and 3 months post ablation with Dr Rayann Heman.   Ablation is on 05/12/15.   Any Other Special Instructions Will Be Listed Below (If Applicable).  Cardiac Ablation Cardiac ablation is a procedure to disable a small amount of heart tissue in very specific places. The heart has many electrical connections. Sometimes these connections are abnormal and can cause the heart to beat very fast or irregularly. By disabling some of the problem areas, heart rhythm can be improved or made normal. Ablation is done for people who:   Have Wolff-Parkinson-White syndrome.   Have other fast heart rhythms (tachycardia).   Have taken medicines for an abnormal heart rhythm (arrhythmia) that resulted in:   No success.   Side effects.   May have a high-risk heartbeat that could result in death.  LET [facility] CARE PROVIDER KNOW ABOUT:   Any allergies you have or any previous reactions you have had to X-ray dye, food (such as seafood), medicine, or tape.    All medicines you are taking, including vitamins, herbs, eye drops, creams, and over-the-counter medicines.   Previous problems you or members of your family have had with the use of anesthetics.   Any blood disorders you have.   Previous surgeries or procedures (such as a kidney transplant) you have had.   Medical conditions you have (such as kidney failure).  RISKS AND COMPLICATIONS Generally, cardiac ablation is a safe procedure. However, problems can occur and include:   Increased risk of cancer. Depending on how long it takes to do the ablation, the dose of radiation can be high.  Bruising and bleeding where a thin, flexible tube (catheter) was inserted during the procedure.   Bleeding into the chest, especially into the sac that surrounds the heart (serious).  Need for a permanent pacemaker if the normal electrical system is damaged.   The procedure may not be fully effective, and this may not be recognized for months. Repeat ablation procedures are sometimes required. BEFORE THE PROCEDURE   Follow any instructions from your health care provider regarding eating and drinking before the procedure.   Take your medicines as directed at regular times with water, unless instructed otherwise by your health care provider. If you are taking diabetes medicine, including insulin, ask how you are to take it and if there are any special instructions you should follow. It is common to adjust insulin dosing the day of the ablation.  PROCEDURE  An ablation is usually performed in a catheterization laboratory with the guidance of fluoroscopy. Fluoroscopy is a type of X-ray that helps your health care provider  see images of your heart during the procedure.   An ablation is a minimally invasive procedure. This means a small cut (incision) is made in either your neck or groin. Your health care provider will decide where to make the incision based on your medical history and physical  exam.  An IV tube will be started before the procedure begins. You will be given an anesthetic or medicine to help you relax (sedative).  The skin on your neck or groin will be numbed. A needle will be inserted into a large vein in your neck or groin and catheters will be threaded to your heart.  A special dye that shows up on fluoroscopy pictures may be injected through the catheter. The dye helps your health care provider see the area of the heart that needs treatment.  The catheter has electrodes on the tip. When the area of heart tissue that is causing the arrhythmia is found, the catheter tip will send an electrical current to the area and "scar" the tissue. Three types of energy can be used to ablate the heart tissue:   Heat (radiofrequency energy).   Laser energy.   Extreme cold (cryoablation).   When the area of the heart has been ablated, the catheter will be taken out. Pressure will be held on the insertion site. This will help the insertion site clot and keep it from bleeding. A bandage will be placed on the insertion site.  AFTER THE PROCEDURE   After the procedure, you will be taken to a recovery area where your vital signs (blood pressure, heart rate, and breathing) will be monitored. The insertion site will also be monitored for bleeding.   You will need to lie still for 4-6 hours. This is to ensure you do not bleed from the catheter insertion site.  Document Released: 02/10/2009 Document Revised: 02/08/2014 Document Reviewed: 02/16/2013 [facility] Patient Information 2015 [place], [place]. This information is not intended to replace advice given to you by your health care provider. Make sure you discuss any questions you have with your health care provider.

## 2015-04-06 NOTE — Progress Notes (Signed)
Electrophysiology Office Note   Date:  04/06/2015   ID:  Cynthia Potts, DOB 1941-02-25, MRN [id number]  PCP:  Albina Billet, MD  Cardiologist:  Dr Nehemiah Massed Primary Electrophysiologist: Thompson Grayer, MD    Chief Complaint  Patient presents with  . PAF     History of Present Illness: Cynthia Potts is a 74 y.o. female who presents today for electrophysiology evaluation.   She has had atrial fibrillation for about a year. This was initially diagnosed after presenting with CHF symptoms. She has required cardioversion previously which was not successful.  Her symptoms of CHF have improved with rate control however she continues to have occasional palpitations and fatigue.  She has failed medical therapy with propafenone and also multaq.  She is therefore referred today for consideration of ablation.   Today, she denies symptoms of palpitations, chest pain, orthopnea, PND, lower extremity edema, claudication, dizziness, presyncope, syncope, bleeding, or neurologic sequela.  She does have mild SOB with her afib. The patient is tolerating medications without difficulties and is otherwise without complaint today.    Past Medical History  Diagnosis Date  . GERD (gastroesophageal reflux disease)   . Persistent atrial fibrillation    Past Surgical History  Procedure Laterality Date  . None       Current Outpatient Prescriptions  Medication Sig Dispense Refill  . apixaban (ELIQUIS) 5 MG TABS tablet Take 5 mg by mouth 2 (two) times daily.    . cimetidine (TAGAMET) 300 MG tablet Take 300 mg by mouth at bedtime.     . metoprolol succinate (TOPROL-XL) 50 MG 24 hr tablet Take 1 tablet by mouth daily.    Marland Kitchen rOPINIRole (REQUIP) 1 MG tablet Take 1 mg by mouth.      No current facility-administered medications for this visit.   Facility-Administered Medications Ordered in Other Visits  Medication Dose Route Frequency Provider Last Rate Last Dose  . propofol (DIPRIVAN) 1000 MG/100ML infusion   10 mcg/kg/min Intravenous Titrated Doctor Chlconversion, MD        Allergies:   Review of patient's allergies indicates no known allergies.   Social History:  The patient  reports that she has quit smoking. She does not have any smokeless tobacco history on file. She reports that she drinks about 0.6 oz of alcohol per week. She reports that she does not use illicit drugs.   Family History:  The patient's  family history includes Diabetes in her mother; Stroke (age of onset: 73) in her father.    ROS:  Please see the history of present illness.   All other systems are reviewed and negative.    PHYSICAL EXAM: VS:  BP 126/78 mmHg  Pulse 86  Ht 5\' 3"  (1.6 m)  Wt 60.873 kg (134 lb 3.2 oz)  BMI 23.78 kg/m2 , BMI Body mass index is 23.78 kg/(m^2). GEN: Well nourished, well developed, in no acute distress HEENT: normal Neck: no JVD, carotid bruits, or masses Cardiac: iRRR; no murmurs, rubs, or gallops,no edema  Respiratory:  clear to auscultation bilaterally, normal work of breathing GI: soft, nontender, nondistended, + BS MS: no deformity or atrophy Skin: warm and dry  Neuro:  Strength and sensation are intact Psych: euthymic mood, full affect  EKG:  EKG is ordered today. The ekg ordered today shows afib, V rate 86 bpm,    Recent Labs: 02/05/2015: BUN 21*; Creatinine 0.97; HGB 13.2; Platelet 161; Potassium 3.5; SGPT (ALT) 21; Sodium 137    Lipid Panel  No results found  for: CHOL, TRIG, HDL, CHOLHDL, VLDL, LDLCALC, LDLDIRECT   Wt Readings from Last 3 Encounters:  04/06/15 60.873 kg (134 lb 3.2 oz)  03/24/15 58.968 kg (130 lb)  02/28/15 57.153 kg (126 lb)      Other studies Reviewed: Additional studies/ records that were reviewed today include: Dr Alveria Apley notes, prior echo  Review of the above records today demonstrates: echo shows normal EF, normal atrial size, mild to moderate MR, mild to moderate TR   ASSESSMENT AND PLAN:  1.  Persistent atrial fibrillation The  patient has symptomatic persistent atrial fibrillation.  She has failed medical therapy with propafenone and multaq. Therapeutic strategies for afib including medicine and ablation were discussed in detail with the patient today. Risk, benefits, and alternatives to EP study and radiofrequency ablation for afib were also discussed in detail today. These risks include but are not limited to stroke, bleeding, vascular damage, tamponade, perforation, damage to the esophagus, lungs, and other structures, pulmonary vein stenosis, worsening renal function, and death. The patient understands these risk and wishes to proceed.  We will therefore proceed with catheter ablation at the next available time. Chads2vasc score is at least 2.  She is anticoagulated with eliquis.  Current medicines are reviewed at length with the patient today.   The patient does not have concerns regarding her medicines.  The following changes were made today:  none  Labs/ tests ordered today include:  Orders Placed This Encounter  Procedures  . Basic metabolic panel  . CBC with Differential     Signed, Thompson Grayer, MD  04/06/2015 9:59 PM     [place] [place] [address] [phone] (office) [phone] (fax)

## 2015-05-05 ENCOUNTER — Other Ambulatory Visit (INDEPENDENT_AMBULATORY_CARE_PROVIDER_SITE_OTHER): Payer: Medicare HMO

## 2015-05-05 DIAGNOSIS — I480 Paroxysmal atrial fibrillation: Secondary | ICD-10-CM

## 2015-05-05 LAB — BASIC METABOLIC PANEL
BUN: 20 mg/dL (ref 6–23)
CALCIUM: 9.6 mg/dL (ref 8.4–10.5)
CO2: 31 mEq/L (ref 19–32)
CREATININE: 1.03 mg/dL (ref 0.40–1.20)
Chloride: 103 mEq/L (ref 96–112)
GFR: 55.99 mL/min — ABNORMAL LOW (ref >60.00)
Glucose, Bld: 139 mg/dL — ABNORMAL HIGH (ref 70–99)
Potassium: 3.9 mEq/L (ref 3.5–5.1)
Sodium: 140 mEq/L (ref 135–145)

## 2015-05-05 LAB — CBC WITH DIFFERENTIAL/PLATELET
Basophils Absolute: 0 10*3/uL (ref 0.0–0.1)
Basophils Relative: 0.3 % (ref 0.0–3.0)
Eosinophils Absolute: 0.1 10*3/uL (ref 0.0–0.7)
Eosinophils Relative: 1.2 % (ref 0.0–5.0)
HEMATOCRIT: 39.6 % (ref 36.0–46.0)
HEMOGLOBIN: 13.3 g/dL (ref 12.0–15.0)
LYMPHS PCT: 24.6 % (ref 12.0–46.0)
Lymphs Abs: 1.4 10*3/uL (ref 0.7–4.0)
MCHC: 33.4 g/dL (ref 30.0–36.0)
MCV: 95.3 fl (ref 78.0–100.0)
Monocytes Absolute: 0.3 10*3/uL (ref 0.1–1.0)
Monocytes Relative: 5.3 % (ref 3.0–12.0)
NEUTROS ABS: 4 10*3/uL (ref 1.4–7.7)
NEUTROS PCT: 68.6 % (ref 43.0–77.0)
Platelets: 168 10*3/uL (ref 150.0–400.0)
RBC: 4.16 Mil/uL (ref 3.87–5.11)
RDW: 14.5 % (ref 11.5–15.5)
WBC: 5.9 10*3/uL (ref 4.0–10.5)

## 2015-05-12 ENCOUNTER — Encounter (HOSPITAL_COMMUNITY)
Admission: RE | Disposition: A | Discharge status: Home / Self Care | Payer: Self-pay | Source: Ambulatory Visit | Attending: Cardiovascular Disease

## 2015-05-12 ENCOUNTER — Ambulatory Visit (HOSPITAL_COMMUNITY)
Admission: RE | Admit: 2015-05-12 | Discharge: 2015-05-12 | Disposition: A | Discharge status: Home / Self Care | Payer: Medicare HMO | Source: Ambulatory Visit | Attending: Cardiovascular Disease | Admitting: Cardiovascular Disease

## 2015-05-12 ENCOUNTER — Encounter (HOSPITAL_COMMUNITY): Payer: Self-pay | Admitting: *Deleted

## 2015-05-12 ENCOUNTER — Ambulatory Visit (HOSPITAL_BASED_OUTPATIENT_CLINIC_OR_DEPARTMENT_OTHER)
Admission: RE | Admit: 2015-05-12 | Discharge: 2015-05-12 | Disposition: A | Discharge status: Home / Self Care | Payer: Medicare HMO | Source: Ambulatory Visit | Attending: Internal Medicine | Admitting: Internal Medicine

## 2015-05-12 ENCOUNTER — Encounter (HOSPITAL_COMMUNITY): Payer: Self-pay | Admitting: Anesthesiology

## 2015-05-12 DIAGNOSIS — I081 Rheumatic disorders of both mitral and tricuspid valves: Secondary | ICD-10-CM | POA: Diagnosis not present

## 2015-05-12 DIAGNOSIS — I481 Persistent atrial fibrillation: Secondary | ICD-10-CM

## 2015-05-12 DIAGNOSIS — I480 Paroxysmal atrial fibrillation: Secondary | ICD-10-CM

## 2015-05-12 DIAGNOSIS — I4891 Unspecified atrial fibrillation: Secondary | ICD-10-CM | POA: Diagnosis present

## 2015-05-12 DIAGNOSIS — Z87891 Personal history of nicotine dependence: Secondary | ICD-10-CM | POA: Diagnosis not present

## 2015-05-12 DIAGNOSIS — I272 Other secondary pulmonary hypertension: Secondary | ICD-10-CM | POA: Diagnosis not present

## 2015-05-12 DIAGNOSIS — Z79899 Other long term (current) drug therapy: Secondary | ICD-10-CM | POA: Diagnosis not present

## 2015-05-12 DIAGNOSIS — I4819 Other persistent atrial fibrillation: Secondary | ICD-10-CM | POA: Diagnosis present

## 2015-05-12 DIAGNOSIS — Z7901 Long term (current) use of anticoagulants: Secondary | ICD-10-CM | POA: Insufficient documentation

## 2015-05-12 HISTORY — PX: TEE WITHOUT CARDIOVERSION: SHX5443

## 2015-05-12 SURGERY — ECHOCARDIOGRAM, TRANSESOPHAGEAL
Anesthesia: Moderate Sedation

## 2015-05-12 SURGERY — ATRIAL FIBRILLATION ABLATION
Anesthesia: Monitor Anesthesia Care

## 2015-05-12 MED ORDER — MIDAZOLAM HCL 5 MG/ML IJ SOLN
INTRAMUSCULAR | Status: AC
  Filled 2015-05-12: qty 2

## 2015-05-12 MED ORDER — MIDAZOLAM HCL 10 MG/2ML IJ SOLN
INTRAMUSCULAR | Status: DC | PRN
  Administered 2015-05-12: 2 mg via INTRAVENOUS
  Administered 2015-05-12: 3 mg via INTRAVENOUS

## 2015-05-12 MED ORDER — DIPHENHYDRAMINE HCL 50 MG/ML IJ SOLN
INTRAMUSCULAR | Status: AC
  Filled 2015-05-12: qty 1

## 2015-05-12 MED ORDER — SODIUM CHLORIDE 0.9 % IV SOLN: INTRAVENOUS | Status: DC

## 2015-05-12 MED ORDER — FENTANYL CITRATE (PF) 100 MCG/2ML IJ SOLN
INTRAMUSCULAR | Status: AC
  Filled 2015-05-12: qty 2

## 2015-05-12 MED ORDER — BUTAMBEN-TETRACAINE-BENZOCAINE 2-2-14 % EX AERO
INHALATION_SPRAY | CUTANEOUS | Status: DC | PRN
  Administered 2015-05-12: 2 via TOPICAL

## 2015-05-12 NOTE — H&P (Signed)
History of Present Illness: Cynthia Potts is a 74 y.o. female who presents today for afib ablation. She has had atrial fibrillation for about a year. This was initially diagnosed after presenting with CHF symptoms. She has required cardioversion previously which was not successful. Her symptoms of CHF have improved with rate control however she continues to have occasional palpitations and fatigue. She has failed medical therapy with propafenone and also multaq. She is therefore referred today for consideration of ablation.   Today, she denies symptoms of palpitations, chest pain, orthopnea, PND, lower extremity edema, claudication, dizziness, presyncope, syncope, bleeding, or neurologic sequela. She does have mild SOB with her afib. The patient is tolerating medications without difficulties and is otherwise without complaint today.    Past Medical History  Diagnosis Date  . GERD (gastroesophageal reflux disease)   . Persistent atrial fibrillation    Past Surgical History  Procedure Laterality Date  . None       Current Outpatient Prescriptions  Medication Sig Dispense Refill  . apixaban (ELIQUIS) 5 MG TABS tablet Take 5 mg by mouth 2 (two) times daily.    . cimetidine (TAGAMET) 300 MG tablet Take 300 mg by mouth at bedtime.     . metoprolol succinate (TOPROL-XL) 50 MG 24 hr tablet Take 1 tablet by mouth daily.    Marland Kitchen rOPINIRole (REQUIP) 1 MG tablet Take 1 mg by mouth.      No current facility-administered medications for this visit.   Facility-Administered Medications Ordered in Other Visits  Medication Dose Route Frequency Provider Last Rate Last Dose  . propofol (DIPRIVAN) 1000 MG/100ML infusion 10 mcg/kg/min Intravenous Titrated Doctor Chlconversion, MD      Allergies: Review of patient's allergies indicates no known allergies.   Social History: The patient  reports that she has quit smoking. She  does not have any smokeless tobacco history on file. She reports that she drinks about 0.6 oz of alcohol per week. She reports that she does not use illicit drugs.   Family History: The patient's family history includes Diabetes in her mother; Stroke (age of onset: 39) in her father.    ROS: Please see the history of present illness. All other systems are reviewed and negative.    PHYSICAL EXAM: Vitals are pending today GEN: Well nourished, well developed, in no acute distress  HEENT: normal  Neck: no JVD, carotid bruits, or masses Cardiac: iRRR; no murmurs, rubs, or gallops,no edema  Respiratory: clear to auscultation bilaterally, normal work of breathing GI: soft, nontender, nondistended, + BS MS: no deformity or atrophy  Skin: warm and dry  Neuro: Strength and sensation are intact Psych: euthymic mood, full affect  EKG: EKG is ordered today. The ekg ordered today shows afib, V rate 86 bpm,    Recent Labs: 02/05/2015: BUN 21*; Creatinine 0.97; HGB 13.2; Platelet 161; Potassium 3.5; SGPT (ALT) 21; Sodium 137    Lipid Panel   Labs (Brief)    No results found for: CHOL, TRIG, HDL, CHOLHDL, VLDL, LDLCALC, LDLDIRECT     Wt Readings from Last 3 Encounters:  04/06/15 60.873 kg (134 lb 3.2 oz)  03/24/15 58.968 kg (130 lb)  02/28/15 57.153 kg (126 lb)     ASSESSMENT AND PLAN:  1. Persistent atrial fibrillation The patient has symptomatic persistent atrial fibrillation. She has failed medical therapy with propafenone and multaq. Therapeutic strategies for afib including medicine and ablation were discussed in detail with the patient today. Risk, benefits, and alternatives to EP study and radiofrequency ablation  for afib were also discussed in detail today. These risks include but are not limited to stroke, bleeding, vascular damage, tamponade, perforation, damage to the esophagus, lungs, and other structures, pulmonary vein stenosis, worsening renal  function, and death. The patient understands these risk and wishes to proceed. We will therefore proceed with catheter ablation at the next available time. Chads2vasc score is at least 2. She is anticoagulated with eliquis.   Thompson Grayer MD, [facility] 05/12/2015 8:19 AM

## 2015-05-12 NOTE — Anesthesia Preprocedure Evaluation (Deleted)
Anesthesia Evaluation  Patient identified by MRN, date of birth, ID band Patient awake    Reviewed: Allergy & Precautions, H&P , NPO status , Patient's Chart, lab work & pertinent test results, reviewed documented beta blocker date and time   Airway        Dental no notable dental hx.    Pulmonary neg pulmonary ROS, former smoker,    Pulmonary exam normal       Cardiovascular + dysrhythmias Atrial Fibrillation     Neuro/Psych negative neurological ROS  negative psych ROS   GI/Hepatic Neg liver ROS, GERD-  Medicated and Controlled,  Endo/Other  negative endocrine ROS  Renal/GU negative Renal ROS  negative genitourinary   Musculoskeletal   Abdominal   Peds  Hematology negative hematology ROS (+)   Anesthesia Other Findings   Reproductive/Obstetrics negative OB ROS                             Anesthesia Physical Anesthesia Plan  ASA: III  Anesthesia Plan: MAC   Post-op Pain Management:    Induction: Intravenous  Airway Management Planned: Simple Face Mask  Additional Equipment:   Intra-op Plan:   Post-operative Plan:   Informed Consent: I have reviewed the patients History and Physical, chart, labs and discussed the procedure including the risks, benefits and alternatives for the proposed anesthesia with the patient or authorized representative who has indicated his/her understanding and acceptance.   Dental advisory given  Plan Discussed with: CRNA  Anesthesia Plan Comments:         Anesthesia Quick Evaluation

## 2015-05-12 NOTE — Progress Notes (Signed)
  Echocardiogram Echocardiogram Transesophageal has been performed.  Donata Clay 05/12/2015, 9:33 AM

## 2015-05-12 NOTE — Discharge Instructions (Signed)
Transesophageal Echocardiogram °Transesophageal echocardiography (TEE) is a picture test of your heart using sound waves. The pictures taken can give very detailed pictures of your heart. This can help your doctor see if there are problems with your heart. TEE can check: °· If your heart has blood clots in it. °· How well your heart valves are working. °· If you have an infection on the inside of your heart. °· Some of the major arteries of your heart. °· If your heart valve is working after a repair. °· Your heart before a procedure that uses a shock to your heart to get the rhythm back to normal. °BEFORE THE PROCEDURE °· Do not eat or drink for 6 hours before the procedure or as told by your doctor. °· Make plans to have someone drive you home after the procedure. Do not drive yourself home. °· An IV tube will be put in your arm. °PROCEDURE °· You will be given a medicine to help you relax (sedative). It will be given through the IV tube. °· A numbing medicine will be sprayed or gargled in the back of your throat to help numb it. °· The tip of the probe is placed into the back of your mouth. You will be asked to swallow. This helps to pass the probe into your esophagus. °· Once the tip of the probe is in the right place, your doctor can take pictures of your heart. °· You may feel pressure at the back of your throat. °AFTER THE PROCEDURE °· You will be taken to a recovery area so the sedative can wear off. °· Your throat may be sore and scratchy. This will go away slowly over time. °· You will go home when you are fully awake and able to swallow liquids. °· You should have someone stay with you for the next 24 hours. °· Do not drive or operate machinery for the next 24 hours. °Document Released: 07/22/2009 Document Revised: 09/29/2013 Document Reviewed: 03/26/2013 °[employer]® Patient Information ©2015 [employer]. This information is not intended to replace advice given to you by your health care provider. Make  sure you discuss any questions you have with your health care provider. ° ° °Conscious Sedation, Adult, Care After °Refer to this sheet in the next few weeks. These instructions provide you with information on caring for yourself after your procedure. Your health care provider may also give you more specific instructions. Your treatment has been planned according to current medical practices, but problems sometimes occur. Call your health care provider if you have any problems or questions after your procedure. °WHAT TO EXPECT AFTER THE PROCEDURE  °After your procedure: °· You may feel sleepy, clumsy, and have poor balance for several hours. °· Vomiting may occur if you eat too soon after the procedure. °HOME CARE INSTRUCTIONS °· Do not participate in any activities where you could become injured for at least 24 hours. Do not: °¨ Drive. °¨ Swim. °¨ Ride a bicycle. °¨ Operate heavy machinery. °¨ Cook. °¨ Use power tools. °¨ Climb ladders. °¨ Work from a high place. °· Do not make important decisions or sign legal documents until you are improved. °· If you vomit, drink water, juice, or soup when you can drink without vomiting. Make sure you have little or no nausea before eating solid foods. °· Only take over-the-counter or prescription medicines for pain, discomfort, or fever as directed by your health care provider. °· Make sure you and your family fully understand everything about   the medicines given to you, including what side effects may occur. °· You should not drink alcohol, take sleeping pills, or take medicines that cause drowsiness for at least 24 hours. °· If you smoke, do not smoke without supervision. °· If you are feeling better, you may resume normal activities 24 hours after you were sedated. °· Keep all appointments with your health care provider. °SEEK MEDICAL CARE IF: °· Your skin is pale or bluish in color. °· You continue to feel nauseous or vomit. °· Your pain is getting worse and is not helped  by medicine. °· You have bleeding or swelling. °· You are still sleepy or feeling clumsy after 24 hours. °SEEK IMMEDIATE MEDICAL CARE IF: °· You develop a rash. °· You have difficulty breathing. °· You develop any type of allergic problem. °· You have a fever. °MAKE SURE YOU: °· Understand these instructions. °· Will watch your condition. °· Will get help right away if you are not doing well or get worse. °Document Released: 07/15/2013 Document Reviewed: 07/15/2013 °[employer]® Patient Information ©2015 [employer]. This information is not intended to replace advice given to you by your health care provider. Make sure you discuss any questions you have with your health care provider. ° °

## 2015-05-12 NOTE — CV Procedure (Signed)
Moderate Biatrial enlargement Modertate MR Moderate to Severe TR No ASD/PFO No LAA thrombus Normal RV Normal EF 60% No effusion Mild PR Mild AV calcification  Jenkins Rouge

## 2015-05-12 NOTE — Progress Notes (Signed)
Pt with PA pressure 60 mm Hg per Dr Johnsie Cancel on TEE. In addition, she has moderate MR, at least moderate biatrial enlargement, and severe TR. Her anticipated success rates with ablation at this time are probably prohibitively low.  I would favor evaluated in the advanced heart failure clinic by Dr Haroldine Laws for evaluation of her severe pulmonary hypertension.   She will also follow-up in the AF clinic for consideration of tikosyn or amiodarone as medicine options for afib rhythm control. I would probably hold off on amiodarone until she has had PFTs/ evaluation of her pulmonary status.  Thompson Grayer MD, [facility] 05/12/2015 10:01 AM

## 2015-05-12 NOTE — H&P (Signed)
  SEE HP form Dr Adolph Pollack Allred Patient is for Ablation today Post [facility] in [place] with recurrence TEE ordered by Dr Rayann Heman to r/o LAA thrombus Prior to procedure.  Jenkins Rouge

## 2015-05-13 ENCOUNTER — Encounter (HOSPITAL_COMMUNITY): Payer: Self-pay | Admitting: Cardiovascular Disease

## 2015-05-19 ENCOUNTER — Encounter (HOSPITAL_COMMUNITY): Payer: Self-pay | Admitting: Nurse Practitioner

## 2015-05-19 ENCOUNTER — Ambulatory Visit (HOSPITAL_COMMUNITY)
Admit: 2015-05-19 | Discharge: 2015-05-19 | Disposition: A | Discharge status: Home / Self Care | Payer: Medicare HMO | Source: Ambulatory Visit | Attending: Nurse Practitioner | Admitting: Nurse Practitioner

## 2015-05-19 VITALS — BP 116/76 | HR 84 | Ht 63.0 in | Wt 135.0 lb

## 2015-05-19 DIAGNOSIS — Z823 Family history of stroke: Secondary | ICD-10-CM | POA: Diagnosis not present

## 2015-05-19 DIAGNOSIS — I4819 Other persistent atrial fibrillation: Secondary | ICD-10-CM

## 2015-05-19 DIAGNOSIS — I481 Persistent atrial fibrillation: Secondary | ICD-10-CM

## 2015-05-19 DIAGNOSIS — Z79899 Other long term (current) drug therapy: Secondary | ICD-10-CM | POA: Diagnosis not present

## 2015-05-19 DIAGNOSIS — K219 Gastro-esophageal reflux disease without esophagitis: Secondary | ICD-10-CM | POA: Insufficient documentation

## 2015-05-19 DIAGNOSIS — Z7902 Long term (current) use of antithrombotics/antiplatelets: Secondary | ICD-10-CM | POA: Diagnosis not present

## 2015-05-19 DIAGNOSIS — Z833 Family history of diabetes mellitus: Secondary | ICD-10-CM | POA: Diagnosis not present

## 2015-05-19 DIAGNOSIS — Z87891 Personal history of nicotine dependence: Secondary | ICD-10-CM | POA: Diagnosis not present

## 2015-05-19 DIAGNOSIS — I4891 Unspecified atrial fibrillation: Secondary | ICD-10-CM | POA: Insufficient documentation

## 2015-05-19 DIAGNOSIS — I272 Other secondary pulmonary hypertension: Secondary | ICD-10-CM | POA: Insufficient documentation

## 2015-05-19 NOTE — Progress Notes (Signed)
Patient ID: Cynthia Potts, female   DOB: [birth year], 74 y.o.   MRN: [id number]     Primary Care Physician: Albina Billet, MD Referring Physician: Dr. [place] Mccomb is a 74 y.o. female with a h/o persistent afib that is in afib clinic for f/u of cancelled ablation for abnormal TEE. She was found to have severe TR, moderate TR and biatrial enlargement, pulmonary hypertnesion for which she is pending an appointment with Heart Failure clinic 8/25.  Ekg shows controlled afib at 84 bpm. She is not symptomatic with afib now but when first diagnosed presented with heart failure symptoms.. Notices some dyspnea at times, more like difficult to take deep breath.Has failed multag  propafenone, and DCCV x1, in the past. She denies PND, orthopnea, no exertional dyspnea. No pedal edema.She doesn't think she snores but husband is not in the bedroom and she can't be sure.  Today, she denies symptoms of palpitations, chest pain, shortness of breath, orthopnea, PND, lower extremity edema, dizziness, presyncope, syncope, or neurologic sequela. The patient is tolerating medications without difficulties and is otherwise without complaint today.   Past Medical History  Diagnosis Date  . GERD (gastroesophageal reflux disease)   . Persistent atrial fibrillation    Past Surgical History  Procedure Laterality Date  . None    . Tee without cardioversion N/A 05/12/2015    Procedure: TRANSESOPHAGEAL ECHOCARDIOGRAM (TEE);  Surgeon: Josue Hector, MD;  Location: [facility] ENDOSCOPY;  Service: Cardiovascular;  Laterality: N/A;    Current Outpatient Prescriptions  Medication Sig Dispense Refill  . apixaban (ELIQUIS) 5 MG TABS tablet Take 5 mg by mouth 2 (two) times daily.    . Biotin 5000 MCG TABS Take 5,000 mcg by mouth daily.    . Calcium Polycarbophil (FIBER-CAPS PO) Take 5 capsules by mouth daily.    . Cholecalciferol (VITAMIN D-3) 1000 UNITS CAPS Take 1,000 Units by mouth daily.     . cimetidine (TAGAMET) 200 MG  tablet Take 200 mg by mouth daily.    . Coenzyme Q10 (CO Q 10) 100 MG CAPS Take 300 mg by mouth daily.    . metoprolol succinate (TOPROL-XL) 50 MG 24 hr tablet Take 50 mg by mouth daily.     . Multiple Vitamins-Minerals (ONE-A-DAY WOMENS 50+ ADVANTAGE PO) Take 1 tablet by mouth daily.    . Omega-3 Krill Oil 500 MG CAPS Take 500 mg by mouth daily.    Marland Kitchen OVER THE COUNTER MEDICATION Take 1 capsule by mouth daily. "[place]bion Magnesium"    . OVER THE COUNTER MEDICATION Take 1 each by mouth daily. "Fiber Well Gummies"    . Probiotic Product (PROBIOTIC PO) Take 1 capsule by mouth daily.    Marland Kitchen rOPINIRole (REQUIP) 1 MG tablet Take 1 mg by mouth daily.     . Sennosides 25 MG TABS Take 25 mg by mouth daily.    Marland Kitchen triamcinolone (NASACORT ALLERGY 24HR) 55 MCG/ACT AERO nasal inhaler Place 2 sprays into the nose daily as needed (for allergies).     No current facility-administered medications for this encounter.   Facility-Administered Medications Ordered in Other Encounters  Medication Dose Route Frequency Provider Last Rate Last Dose  . propofol (DIPRIVAN) 1000 MG/100ML infusion  10 mcg/kg/min Intravenous Titrated Doctor Chlconversion, MD        No Known Allergies  Social History   Social History  . Marital Status: Married    Spouse Name: N/A  . Number of Children: N/A  . Years of Education: N/A  Occupational History  . Not on file.   Social History Main Topics  . Smoking status: Former Smoker -- 30 years  . Smokeless tobacco: Not on file  . Alcohol Use: 0.6 oz/week    1 Glasses of wine per week     Comment: daily  . Drug Use: No  . Sexual Activity: Not on file   Other Topics Concern  . Not on file   Social History Narrative   Lives in [place].  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    Family History  Problem Relation Age of Onset  . Stroke Father 46  . Diabetes Mother     ROS- All systems are reviewed and negative except as per the HPI  above  Physical Exam: Filed Vitals:   05/19/15 1028  BP: 116/76  Pulse: 84  Height: 5\' 3"  (1.6 m)  Weight: 135 lb (61.236 kg)    GEN- The patient is well appearing, alert and oriented x 3 today.   Head- normocephalic, atraumatic Eyes-  Sclera clear, conjunctiva pink Ears- hearing intact Oropharynx- clear Neck- supple, no JVP Lymph- no cervical lymphadenopathy Lungs- Clear to ausculation bilaterally, normal work of breathing Heart- Regular rate and rhythm, no murmurs, rubs or gallops, PMI not laterally displaced GI- soft, NT, ND, + BS Extremities- no clubbing, cyanosis, or edema MS- no significant deformity or atrophy Skin- no rash or lesion Psych- euthymic mood, full affect Neuro- strength and sensation are intact  EKG- Afib at 84 bpm, QRS 72 ms, QYC 482 ms.  Assessment and Plan: 1. Afib  Rate controlled  Continue metoprolol Continue apixaban  2. Abnormal TEE with severe TR, PUL HTN PFT's, Sleep study ordered per Dr. Rayann Heman 's recommendation Pending HF clinic evaluation  Afib clinic as needed  Butch Penny C. Asuna Peth, [facility] [address] [place], [place] [address] [phone]

## 2015-05-20 ENCOUNTER — Other Ambulatory Visit: Payer: Self-pay | Admitting: *Deleted

## 2015-05-20 DIAGNOSIS — G4733 Obstructive sleep apnea (adult) (pediatric): Secondary | ICD-10-CM

## 2015-05-31 ENCOUNTER — Telehealth (HOSPITAL_COMMUNITY): Payer: Self-pay | Admitting: Nurse Practitioner

## 2015-05-31 NOTE — Telephone Encounter (Signed)
Pt called to report that she developed discomfort between her shoulder blades that she noticed last night turning over in bed. When she first got up the pain was sharp and she felt it more with deep breaths. Now it has lessened. She is not short of breath and her heart rate appears controlled,otherwise feels ok.. She just wanted to report discomfort in case that it could be part of her recent TEE findings. She is on blood thinner and has not missed any doses. She can reproduce the pain with me on the phone moving her shoulders. It sounds like it may musculoskeletal and she was asked to see her PCP if does not improve.

## 2015-06-02 ENCOUNTER — Other Ambulatory Visit: Payer: Self-pay | Admitting: *Deleted

## 2015-06-02 ENCOUNTER — Encounter (HOSPITAL_COMMUNITY): Payer: Self-pay

## 2015-06-02 ENCOUNTER — Encounter (HOSPITAL_COMMUNITY): Payer: Self-pay | Admitting: *Deleted

## 2015-06-02 ENCOUNTER — Ambulatory Visit (HOSPITAL_COMMUNITY)
Admit: 2015-06-02 | Discharge: 2015-06-02 | Disposition: A | Discharge status: Home / Self Care | Payer: Medicare HMO | Source: Ambulatory Visit | Attending: Internal Medicine | Admitting: Internal Medicine

## 2015-06-02 VITALS — BP 106/68 | HR 87 | Wt 135.0 lb

## 2015-06-02 DIAGNOSIS — K219 Gastro-esophageal reflux disease without esophagitis: Secondary | ICD-10-CM | POA: Insufficient documentation

## 2015-06-02 DIAGNOSIS — I071 Rheumatic tricuspid insufficiency: Secondary | ICD-10-CM | POA: Insufficient documentation

## 2015-06-02 DIAGNOSIS — I272 Other secondary pulmonary hypertension: Secondary | ICD-10-CM | POA: Diagnosis present

## 2015-06-02 DIAGNOSIS — J449 Chronic obstructive pulmonary disease, unspecified: Secondary | ICD-10-CM | POA: Insufficient documentation

## 2015-06-02 DIAGNOSIS — I482 Chronic atrial fibrillation: Secondary | ICD-10-CM | POA: Diagnosis not present

## 2015-06-02 DIAGNOSIS — Z8249 Family history of ischemic heart disease and other diseases of the circulatory system: Secondary | ICD-10-CM | POA: Diagnosis not present

## 2015-06-02 DIAGNOSIS — Z823 Family history of stroke: Secondary | ICD-10-CM | POA: Diagnosis not present

## 2015-06-02 DIAGNOSIS — Z79899 Other long term (current) drug therapy: Secondary | ICD-10-CM | POA: Diagnosis not present

## 2015-06-02 DIAGNOSIS — Z87891 Personal history of nicotine dependence: Secondary | ICD-10-CM | POA: Insufficient documentation

## 2015-06-02 DIAGNOSIS — I270 Primary pulmonary hypertension: Secondary | ICD-10-CM

## 2015-06-02 DIAGNOSIS — Z7902 Long term (current) use of antithrombotics/antiplatelets: Secondary | ICD-10-CM | POA: Diagnosis not present

## 2015-06-02 DIAGNOSIS — I4819 Other persistent atrial fibrillation: Secondary | ICD-10-CM

## 2015-06-02 DIAGNOSIS — I481 Persistent atrial fibrillation: Secondary | ICD-10-CM

## 2015-06-02 DIAGNOSIS — L4050 Arthropathic psoriasis, unspecified: Secondary | ICD-10-CM | POA: Insufficient documentation

## 2015-06-02 LAB — BRAIN NATRIURETIC PEPTIDE: B Natriuretic Peptide: 237.5 pg/mL — ABNORMAL HIGH (ref 0.0–100.0)

## 2015-06-02 NOTE — Patient Instructions (Signed)
Labs today  Right Heart Catheterization on Mon 8/25, see instruction sheet  Your physician recommends that you schedule a follow-up appointment in: 3 weeks

## 2015-06-03 DIAGNOSIS — I272 Pulmonary hypertension, unspecified: Secondary | ICD-10-CM | POA: Insufficient documentation

## 2015-06-03 DIAGNOSIS — I071 Rheumatic tricuspid insufficiency: Secondary | ICD-10-CM | POA: Insufficient documentation

## 2015-06-03 LAB — RHEUMATOID FACTOR: Rhuematoid fact SerPl-aCnc: <10 IU/mL (ref 0.0–13.9)

## 2015-06-03 NOTE — Progress Notes (Signed)
Patient ID: Cynthia Potts, female   DOB: August 01, 1941, 74 y.o.   MRN: [id number] PCP: Benita Stabile Referring: Dr Rayann Heman  74 yo with history of chronic atrial fibrillation and valvular heart disease/pulmonary hypertension by TEE presents for cardiology evaluation. She has been in atrial fibrillation for about a year.  In 4/16, she developed chest heaviness and there was concern for diastolic CHF.  At that point, it was decided to try to cardiovert her.  DCCV failed in 5/16.  DCCV was attempted again in 6/16.  She convered to NSR briefly then went back into atrial fibrillation.  She failed both Multaq and propafenone.  She was referred to Dr Rayann Heman for evaluation for atrial fibrillation ablation.  Pre-TEE ablation showed severe TR with PA systolic pressure 60 mmHg as well as moderate MR.  Given the valvular disease and pulmonary hypertension, it was decided to postpone ablation, and she was referred to this office.   She seems to be doing well symptomatically.  No further chest heaviness.  She is mildly short of breath walking up steps.  She is able to walk on flat ground and do housework without dyspnea.  No lightheadedness.  No orthopnea/PND.  She does not feel palpitations.  She has a history of psoriatic arthritis that has been quiescent.  She was on Enbrel in the past.  No joint pain currently, no rash.   ECG: atrial fibrillation  Labs (7/16): K 3.9, creatinine 1.03, HCT 39.6  PMH: 1. Atrial fibrillation: Chronic.  Began about a year ago.  DCCV in 5/16 failed.  DCCV in 6/16 held NSR for about an hour then failed.  Has failed both Multaq and propafenone.  Considered for ablation but decided against due to valvular disease/pulmonary hypertension.  2. Valvular heart disease/diastolic CHF: TEE (0/97) with EF 60%, moderate MR, normal RV size and systolic function, PASP 60 mmHg with severe TR.  3. GERD 4. COPD: Moderate by PFTs.  PFTs (8/16) with FVC 104%, FEV1 80%, ratio 66%, TLC 96%, DLCO 95%.  5.  Psoriatic arthritis: Quiescent currently.   SH: Lives in [place], works as Product manager at [employer], married, prior smoker, occasional ETOH.  Retired Marine scientist.    FH: Mother with MI in her 64s, father with CVA in his 75s.   ROS: All systems reviewed and negative except as per HPI.   Current Outpatient Prescriptions  Medication Sig Dispense Refill  . apixaban (ELIQUIS) 5 MG TABS tablet Take 5 mg by mouth 2 (two) times daily.    . Biotin 5000 MCG TABS Take 5,000 mcg by mouth daily.    . Calcium Polycarbophil (FIBER-CAPS PO) Take 5 capsules by mouth daily.    . Cholecalciferol (VITAMIN D-3) 1000 UNITS CAPS Take 1,000 Units by mouth daily.     . cimetidine (TAGAMET) 200 MG tablet Take 200 mg by mouth daily.    . Coenzyme Q10 (CO Q 10) 100 MG CAPS Take 300 mg by mouth daily.    . metoprolol succinate (TOPROL-XL) 50 MG 24 hr tablet Take 50 mg by mouth daily.     . Multiple Vitamins-Minerals (ONE-A-DAY WOMENS 50+ ADVANTAGE PO) Take 1 tablet by mouth daily.    . Omega-3 Krill Oil 500 MG CAPS Take 500 mg by mouth daily.    Marland Kitchen OVER THE COUNTER MEDICATION Take 1 capsule by mouth daily. "[place]ion Magnesium"    . OVER THE COUNTER MEDICATION Take 1 each by mouth daily. "Fiber Well Gummies"    . Probiotic Product (PROBIOTIC PO) Take 1  capsule by mouth daily.    Marland Kitchen rOPINIRole (REQUIP) 1 MG tablet Take 1 mg by mouth daily.     . Sennosides 25 MG TABS Take 25 mg by mouth daily.    Marland Kitchen triamcinolone (NASACORT ALLERGY 24HR) 55 MCG/ACT AERO nasal inhaler Place 2 sprays into the nose daily as needed (for allergies).     No current facility-administered medications for this encounter.   Facility-Administered Medications Ordered in Other Encounters  Medication Dose Route Frequency Provider Last Rate Last Dose  . propofol (DIPRIVAN) 1000 MG/100ML infusion  10 mcg/kg/min Intravenous Titrated Doctor Chlconversion, MD       BP 106/68 mmHg  Pulse 87  Wt 135 lb (61.236 kg)  SpO2 99% General: NAD Neck: No  JVD, no thyromegaly or thyroid nodule.  Lungs: Clear to auscultation bilaterally with normal respiratory effort. CV: Nondisplaced PMI.  Heart irregular S1/S2, no S3/S4, 2/6 HSM LLSB.  No peripheral edema.  No carotid bruit.  Normal pedal pulses.  Abdomen: Soft, nontender, no hepatosplenomegaly, no distention.  Skin: Intact without lesions or rashes.  Neurologic: Alert and oriented x 3.  Psych: Normal affect. Extremities: No clubbing or cyanosis.  HEENT: Normal.   Assessment/Plan: 1. Pulmonary hypertension: Patient was noted by TEE to have moderate pulmonary hypertension.  It is unclear whether this is pulmonary venous hypertension or pulmonary arterial hypertension.  She has NYHA class II symptoms (mild dyspnea with heavier exertion).  PFTs recently showed moderate airways obstruction (likely COPD from history of smoking), but I do not think this should be severe enough to cause significant pulmonary hypertension.  Interstingly, the DLCO was not significantly decreased.  Interestingly, she has a history of rheumatological disease with psoriatic arthritis history, which raises the question of Group 1 PH in the setting of rheumatological disease. Group I PAH needs to be treated => severe TR could be related.  - She needs a right heart cath for diagnosis.  We discussed this today in depth going over risks/benefits.  She agrees to proceed with this test. Hold Eliquis 2 days prior to study.  - Sleep study has been scheduled.  - I will send rheumatological serologies: ANA, RF, anti-RNP, anti-centromere antibody.  - Send BNP.  2. Atrial fibrillation: Chronic.  Would be reasonable to attempt conversion to NSR using Tikosyn.  Will aim for this after pulmonary hypertension  workup is completed.  Continue Eliquis and Toprol XL.  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  This may be due to RV strain/failure in the setting of pulmonary hypertension.  Workup with RHC as above.   Loralie Champagne 06/03/2015 7:13  AM

## 2015-06-06 ENCOUNTER — Encounter (HOSPITAL_COMMUNITY)
Admission: RE | Disposition: A | Discharge status: Home / Self Care | Payer: Medicare HMO | Source: Ambulatory Visit | Attending: Cardiology

## 2015-06-06 ENCOUNTER — Encounter (HOSPITAL_COMMUNITY): Payer: Self-pay | Admitting: Cardiology

## 2015-06-06 ENCOUNTER — Telehealth (HOSPITAL_COMMUNITY): Payer: Self-pay | Admitting: Cardiology

## 2015-06-06 ENCOUNTER — Ambulatory Visit (HOSPITAL_COMMUNITY)
Admission: RE | Admit: 2015-06-06 | Discharge: 2015-06-06 | Disposition: A | Discharge status: Home / Self Care | Payer: Medicare HMO | Source: Ambulatory Visit | Attending: Cardiology | Admitting: Cardiology

## 2015-06-06 DIAGNOSIS — L4050 Arthropathic psoriasis, unspecified: Secondary | ICD-10-CM | POA: Insufficient documentation

## 2015-06-06 DIAGNOSIS — J449 Chronic obstructive pulmonary disease, unspecified: Secondary | ICD-10-CM | POA: Diagnosis not present

## 2015-06-06 DIAGNOSIS — K219 Gastro-esophageal reflux disease without esophagitis: Secondary | ICD-10-CM | POA: Diagnosis not present

## 2015-06-06 DIAGNOSIS — I482 Chronic atrial fibrillation: Secondary | ICD-10-CM | POA: Diagnosis not present

## 2015-06-06 DIAGNOSIS — I361 Nonrheumatic tricuspid (valve) insufficiency: Secondary | ICD-10-CM | POA: Diagnosis not present

## 2015-06-06 DIAGNOSIS — I272 Other secondary pulmonary hypertension: Secondary | ICD-10-CM | POA: Insufficient documentation

## 2015-06-06 DIAGNOSIS — I5032 Chronic diastolic (congestive) heart failure: Secondary | ICD-10-CM | POA: Insufficient documentation

## 2015-06-06 DIAGNOSIS — Z7901 Long term (current) use of anticoagulants: Secondary | ICD-10-CM | POA: Diagnosis not present

## 2015-06-06 DIAGNOSIS — I270 Primary pulmonary hypertension: Secondary | ICD-10-CM | POA: Diagnosis not present

## 2015-06-06 HISTORY — PX: CARDIAC CATHETERIZATION: SHX172

## 2015-06-06 LAB — POCT I-STAT 3, VENOUS BLOOD GAS (G3P V)
BICARBONATE: 25.3 meq/L — AB (ref 20.0–24.0)
Bicarbonate: 25.5 mEq/L — ABNORMAL HIGH (ref 20.0–24.0)
O2 Saturation: 63 %
O2 Saturation: 64 %
PCO2 VEN: 45.7 mmHg (ref 45.0–50.0)
PO2 VEN: 35 mmHg (ref 30.0–45.0)
TCO2: 27 mmol/L (ref 0–100)
TCO2: 27 mmol/L (ref 0–100)
pCO2, Ven: 44.9 mmHg — ABNORMAL LOW (ref 45.0–50.0)
pH, Ven: 7.356 — ABNORMAL HIGH (ref 7.250–7.300)
pH, Ven: 7.36 — ABNORMAL HIGH (ref 7.250–7.300)
pO2, Ven: 34 mmHg (ref 30.0–45.0)

## 2015-06-06 LAB — BASIC METABOLIC PANEL
ANION GAP: 6 (ref 5–15)
BUN: 19 mg/dL (ref 6–20)
CO2: 28 mmol/L (ref 22–32)
Calcium: 9.1 mg/dL (ref 8.9–10.3)
Chloride: 108 mmol/L (ref 101–111)
Creatinine, Ser: 1 mg/dL (ref 0.44–1.00)
GFR, EST NON AFRICAN AMERICAN: 55 mL/min — AB (ref >60)
GLUCOSE: 98 mg/dL (ref 65–99)
POTASSIUM: 4.4 mmol/L (ref 3.5–5.1)
Sodium: 142 mmol/L (ref 135–145)

## 2015-06-06 LAB — CBC
HEMATOCRIT: 39.4 % (ref 36.0–46.0)
HEMOGLOBIN: 13.4 g/dL (ref 12.0–15.0)
MCH: 32.3 pg (ref 26.0–34.0)
MCHC: 34 g/dL (ref 30.0–36.0)
MCV: 94.9 fL (ref 78.0–100.0)
Platelets: 151 10*3/uL (ref 150–400)
RBC: 4.15 MIL/uL (ref 3.87–5.11)
RDW: 13.4 % (ref 11.5–15.5)
WBC: 4.4 10*3/uL (ref 4.0–10.5)

## 2015-06-06 LAB — PROTIME-INR
INR: 1.09 (ref 0.00–1.49)
PROTHROMBIN TIME: 14.3 s (ref 11.6–15.2)

## 2015-06-06 LAB — CENTROMERE ANTIBODIES: CENTROMERE AB SCREEN: NEGATIVE

## 2015-06-06 SURGERY — RIGHT HEART CATH
Anesthesia: LOCAL

## 2015-06-06 MED ORDER — FENTANYL CITRATE (PF) 100 MCG/2ML IJ SOLN
INTRAMUSCULAR | Status: AC
  Filled 2015-06-06: qty 4

## 2015-06-06 MED ORDER — SODIUM CHLORIDE 0.9 % IV SOLN: 250.0000 mL | INTRAVENOUS | Status: DC | PRN

## 2015-06-06 MED ORDER — SODIUM CHLORIDE 0.9 % IJ SOLN: 3.0000 mL | Freq: Two times a day (BID) | INTRAMUSCULAR | Status: DC

## 2015-06-06 MED ORDER — HEPARIN (PORCINE) IN NACL 2-0.9 UNIT/ML-% IJ SOLN
INTRAMUSCULAR | Status: AC
  Filled 2015-06-06: qty 1000

## 2015-06-06 MED ORDER — SODIUM CHLORIDE 0.9 % IJ SOLN: 3.0000 mL | INTRAMUSCULAR | Status: DC | PRN

## 2015-06-06 MED ORDER — FENTANYL CITRATE (PF) 100 MCG/2ML IJ SOLN
INTRAMUSCULAR | Status: DC | PRN
  Administered 2015-06-06: 25 ug via INTRAVENOUS

## 2015-06-06 MED ORDER — MIDAZOLAM HCL 2 MG/2ML IJ SOLN
INTRAMUSCULAR | Status: DC | PRN
  Administered 2015-06-06: 1 mg via INTRAVENOUS

## 2015-06-06 MED ORDER — ACETAMINOPHEN 325 MG PO TABS: 650.0000 mg | ORAL_TABLET | ORAL | Status: DC | PRN

## 2015-06-06 MED ORDER — MIDAZOLAM HCL 2 MG/2ML IJ SOLN
INTRAMUSCULAR | Status: AC
  Filled 2015-06-06: qty 4

## 2015-06-06 MED ORDER — SODIUM CHLORIDE 0.9 % IV SOLN: INTRAVENOUS | Status: DC

## 2015-06-06 MED ORDER — LIDOCAINE HCL (PF) 1 % IJ SOLN
INTRAMUSCULAR | Status: AC
  Filled 2015-06-06: qty 30

## 2015-06-06 MED ORDER — ONDANSETRON HCL 4 MG/2ML IJ SOLN: 4.0000 mg | Freq: Four times a day (QID) | INTRAMUSCULAR | Status: DC | PRN

## 2015-06-06 SURGICAL SUPPLY — 7 items
CATH BALLN WEDGE 5F 110CM (CATHETERS) ×2 IMPLANT
GLIDESHEATH SLEND SS 6F .021 (SHEATH) ×2 IMPLANT
GUIDEWIRE .025 260CM (WIRE) ×2 IMPLANT
PACK CARDIAC CATHETERIZATION (CUSTOM PROCEDURE TRAY) ×2 IMPLANT
SHEATH FAST CATH BRACH 5F 5CM (SHEATH) ×4 IMPLANT
SHEATH PINNACLE 5F 10CM (SHEATH) ×2 IMPLANT
TRANSDUCER W/STOPCOCK (MISCELLANEOUS) ×2 IMPLANT

## 2015-06-06 NOTE — H&P (View-Only) (Signed)
Patient ID: Cynthia Potts, female   DOB: 06/17/1941, 74 y.o.   MRN: [id number] PCP: Benita Stabile Referring: Dr Rayann Heman  74 yo with history of chronic atrial fibrillation and valvular heart disease/pulmonary hypertension by TEE presents for cardiology evaluation. She has been in atrial fibrillation for about a year.  In 4/16, she developed chest heaviness and there was concern for diastolic CHF.  At that point, it was decided to try to cardiovert her.  DCCV failed in 5/16.  DCCV was attempted again in 6/16.  She convered to NSR briefly then went back into atrial fibrillation.  She failed both Multaq and propafenone.  She was referred to Dr Rayann Heman for evaluation for atrial fibrillation ablation.  Pre-TEE ablation showed severe TR with PA systolic pressure 60 mmHg as well as moderate MR.  Given the valvular disease and pulmonary hypertension, it was decided to postpone ablation, and she was referred to this office.   She seems to be doing well symptomatically.  No further chest heaviness.  She is mildly short of breath walking up steps.  She is able to walk on flat ground and do housework without dyspnea.  No lightheadedness.  No orthopnea/PND.  She does not feel palpitations.  She has a history of psoriatic arthritis that has been quiescent.  She was on Enbrel in the past.  No joint pain currently, no rash.   ECG: atrial fibrillation  Labs (7/16): K 3.9, creatinine 1.03, HCT 39.6  PMH: 1. Atrial fibrillation: Chronic.  Began about a year ago.  DCCV in 5/16 failed.  DCCV in 6/16 held NSR for about an hour then failed.  Has failed both Multaq and propafenone.  Considered for ablation but decided against due to valvular disease/pulmonary hypertension.  2. Valvular heart disease/diastolic CHF: TEE (3/15) with EF 60%, moderate MR, normal RV size and systolic function, PASP 60 mmHg with severe TR.  3. GERD 4. COPD: Moderate by PFTs.  PFTs (8/16) with FVC 104%, FEV1 80%, ratio 66%, TLC 96%, DLCO 95%.  5.  Psoriatic arthritis: Quiescent currently.   SH: Lives in [place], works as Product manager at [employer], married, prior smoker, occasional ETOH.  Retired Marine scientist.    FH: Mother with MI in her 2s, father with CVA in his 64s.   ROS: All systems reviewed and negative except as per HPI.   Current Outpatient Prescriptions  Medication Sig Dispense Refill  . apixaban (ELIQUIS) 5 MG TABS tablet Take 5 mg by mouth 2 (two) times daily.    . Biotin 5000 MCG TABS Take 5,000 mcg by mouth daily.    . Calcium Polycarbophil (FIBER-CAPS PO) Take 5 capsules by mouth daily.    . Cholecalciferol (VITAMIN D-3) 1000 UNITS CAPS Take 1,000 Units by mouth daily.     . cimetidine (TAGAMET) 200 MG tablet Take 200 mg by mouth daily.    . Coenzyme Q10 (CO Q 10) 100 MG CAPS Take 300 mg by mouth daily.    . metoprolol succinate (TOPROL-XL) 50 MG 24 hr tablet Take 50 mg by mouth daily.     . Multiple Vitamins-Minerals (ONE-A-DAY WOMENS 50+ ADVANTAGE PO) Take 1 tablet by mouth daily.    . Omega-3 Krill Oil 500 MG CAPS Take 500 mg by mouth daily.    Marland Kitchen OVER THE COUNTER MEDICATION Take 1 capsule by mouth daily. "[place] Magnesium"    . OVER THE COUNTER MEDICATION Take 1 each by mouth daily. "Fiber Well Gummies"    . Probiotic Product (PROBIOTIC PO) Take 1  capsule by mouth daily.    Marland Kitchen rOPINIRole (REQUIP) 1 MG tablet Take 1 mg by mouth daily.     . Sennosides 25 MG TABS Take 25 mg by mouth daily.    Marland Kitchen triamcinolone (NASACORT ALLERGY 24HR) 55 MCG/ACT AERO nasal inhaler Place 2 sprays into the nose daily as needed (for allergies).     No current facility-administered medications for this encounter.   Facility-Administered Medications Ordered in Other Encounters  Medication Dose Route Frequency Provider Last Rate Last Dose  . propofol (DIPRIVAN) 1000 MG/100ML infusion  10 mcg/kg/min Intravenous Titrated Doctor Chlconversion, MD       BP 106/68 mmHg  Pulse 87  Wt 135 lb (61.236 kg)  SpO2 99% General: NAD Neck: No  JVD, no thyromegaly or thyroid nodule.  Lungs: Clear to auscultation bilaterally with normal respiratory effort. CV: Nondisplaced PMI.  Heart irregular S1/S2, no S3/S4, 2/6 HSM LLSB.  No peripheral edema.  No carotid bruit.  Normal pedal pulses.  Abdomen: Soft, nontender, no hepatosplenomegaly, no distention.  Skin: Intact without lesions or rashes.  Neurologic: Alert and oriented x 3.  Psych: Normal affect. Extremities: No clubbing or cyanosis.  HEENT: Normal.   Assessment/Plan: 1. Pulmonary hypertension: Patient was noted by TEE to have moderate pulmonary hypertension.  It is unclear whether this is pulmonary venous hypertension or pulmonary arterial hypertension.  She has NYHA class II symptoms (mild dyspnea with heavier exertion).  PFTs recently showed moderate airways obstruction (likely COPD from history of smoking), but I do not think this should be severe enough to cause significant pulmonary hypertension.  Interstingly, the DLCO was not significantly decreased.  Interestingly, she has a history of rheumatological disease with psoriatic arthritis history, which raises the question of Group 1 PH in the setting of rheumatological disease. Group I PAH needs to be treated => severe TR could be related.  - She needs a right heart cath for diagnosis.  We discussed this today in depth going over risks/benefits.  She agrees to proceed with this test. Hold Eliquis 2 days prior to study.  - Sleep study has been scheduled.  - I will send rheumatological serologies: ANA, RF, anti-RNP, anti-centromere antibody.  - Send BNP.  2. Atrial fibrillation: Chronic.  Would be reasonable to attempt conversion to NSR using Tikosyn.  Will aim for this after pulmonary hypertension  workup is completed.  Continue Eliquis and Toprol XL.  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  This may be due to RV strain/failure in the setting of pulmonary hypertension.  Workup with RHC as above.   Loralie Champagne 06/03/2015 7:13  AM

## 2015-06-06 NOTE — Interval H&P Note (Signed)
History and Physical Interval Note:  06/06/2015 11:10 AM  Cynthia Potts  has presented today for surgery, with the diagnosis of pulmonary hypertension  The various methods of treatment have been discussed with the patient and family. After consideration of risks, benefits and other options for treatment, the patient has consented to  Procedure(s): Right Heart Cath (N/A) as a surgical intervention .  The patient's history has been reviewed, patient examined, no change in status, stable for surgery.  I have reviewed the patient's chart and labs.  Questions were answered to the patient's satisfaction.     Marven Veley [employer]

## 2015-06-06 NOTE — Discharge Instructions (Signed)

## 2015-06-06 NOTE — Telephone Encounter (Signed)
Patient scheduled for RHC with Dr.McLean on 06/05/2018 Cpt [id number] With patients current insurance- [employer] Pre cert#  [id number]

## 2015-06-07 MED FILL — Lidocaine HCl Local Preservative Free (PF) Inj 1%: INTRAMUSCULAR | Qty: 30 | Status: AC

## 2015-06-07 MED FILL — Heparin Sodium (Porcine) 2 Unit/ML in Sodium Chloride 0.9%: INTRAMUSCULAR | Qty: 1000 | Status: AC

## 2015-06-08 ENCOUNTER — Telehealth (HOSPITAL_COMMUNITY): Payer: Self-pay | Admitting: Vascular Surgery

## 2015-06-08 NOTE — Telephone Encounter (Signed)
Pt called she had a cath last week w/ Mclean , she states Mclean wanted to see her back sooner than her scheduled appt 06/30/15. Could you please look in to this and call pt if Mclean wants to see her sooner?

## 2015-06-09 ENCOUNTER — Ambulatory Visit (HOSPITAL_COMMUNITY): Payer: Medicare HMO | Admitting: Nurse Practitioner

## 2015-06-10 ENCOUNTER — Telehealth (HOSPITAL_COMMUNITY): Payer: Self-pay | Admitting: Vascular Surgery

## 2015-06-10 NOTE — Telephone Encounter (Signed)
Called pt home # no voicemail available , left message on cell phone to get her an appt for Tues 06/14/15

## 2015-06-14 ENCOUNTER — Ambulatory Visit (HOSPITAL_COMMUNITY)
Admission: RE | Admit: 2015-06-14 | Discharge: 2015-06-14 | Disposition: A | Discharge status: Home / Self Care | Payer: Medicare HMO | Source: Ambulatory Visit | Attending: Cardiology | Admitting: Cardiology

## 2015-06-14 VITALS — BP 96/52 | HR 88 | Wt 135.0 lb

## 2015-06-14 DIAGNOSIS — I5032 Chronic diastolic (congestive) heart failure: Secondary | ICD-10-CM | POA: Insufficient documentation

## 2015-06-14 DIAGNOSIS — I481 Persistent atrial fibrillation: Secondary | ICD-10-CM

## 2015-06-14 DIAGNOSIS — I272 Other secondary pulmonary hypertension: Secondary | ICD-10-CM | POA: Diagnosis present

## 2015-06-14 DIAGNOSIS — I081 Rheumatic disorders of both mitral and tricuspid valves: Secondary | ICD-10-CM | POA: Insufficient documentation

## 2015-06-14 DIAGNOSIS — Z823 Family history of stroke: Secondary | ICD-10-CM | POA: Insufficient documentation

## 2015-06-14 DIAGNOSIS — Z7902 Long term (current) use of antithrombotics/antiplatelets: Secondary | ICD-10-CM | POA: Insufficient documentation

## 2015-06-14 DIAGNOSIS — I482 Chronic atrial fibrillation: Secondary | ICD-10-CM | POA: Insufficient documentation

## 2015-06-14 DIAGNOSIS — I071 Rheumatic tricuspid insufficiency: Secondary | ICD-10-CM

## 2015-06-14 DIAGNOSIS — K219 Gastro-esophageal reflux disease without esophagitis: Secondary | ICD-10-CM | POA: Diagnosis not present

## 2015-06-14 DIAGNOSIS — I270 Primary pulmonary hypertension: Secondary | ICD-10-CM | POA: Diagnosis not present

## 2015-06-14 DIAGNOSIS — I340 Nonrheumatic mitral (valve) insufficiency: Secondary | ICD-10-CM

## 2015-06-14 DIAGNOSIS — Z8249 Family history of ischemic heart disease and other diseases of the circulatory system: Secondary | ICD-10-CM | POA: Insufficient documentation

## 2015-06-14 DIAGNOSIS — I4819 Other persistent atrial fibrillation: Secondary | ICD-10-CM

## 2015-06-14 DIAGNOSIS — Z79899 Other long term (current) drug therapy: Secondary | ICD-10-CM | POA: Diagnosis not present

## 2015-06-14 DIAGNOSIS — Z87891 Personal history of nicotine dependence: Secondary | ICD-10-CM | POA: Diagnosis not present

## 2015-06-14 DIAGNOSIS — J449 Chronic obstructive pulmonary disease, unspecified: Secondary | ICD-10-CM | POA: Diagnosis not present

## 2015-06-14 MED ORDER — POTASSIUM CHLORIDE CRYS ER 20 MEQ PO TBCR: 10.0000 meq | EXTENDED_RELEASE_TABLET | Freq: Every day | ORAL | Status: DC

## 2015-06-14 MED ORDER — FUROSEMIDE 20 MG PO TABS: 10.0000 mg | ORAL_TABLET | Freq: Every day | ORAL | Status: DC

## 2015-06-14 NOTE — Patient Instructions (Signed)
Start Furosemide (Lasix) 10 mg daily  Start Potassium 10 meq daily  Labs in 10 days  Your physician has requested that you have an echocardiogram. Echocardiography is a painless test that uses sound waves to create images of your heart. It provides your doctor with information about the size and shape of your heart and how well your heart's chambers and valves are working. This procedure takes approximately one hour. There are no restrictions for this procedure.  You have been referred to Porfirio Oar, Pharm D for Tikosyn  We will contact you in 2 months to schedule your next appointment.

## 2015-06-14 NOTE — Progress Notes (Signed)
Patient ID: Bernyce Brimley, female   DOB: 09-15-1941, 74 y.o.   MRN: [id number] PCP: Benita Stabile Referring: Dr Rayann Heman  74 yo with history of chronic atrial fibrillation and valvular heart disease/pulmonary hypertension by TEE presents for cardiology evaluation. She has been in atrial fibrillation for about a year.  In 4/16, she developed chest heaviness and there was concern for diastolic CHF.  At that point, it was decided to try to cardiovert her.  DCCV failed in 5/16.  DCCV was attempted again in 6/16.  She convered to NSR briefly then went back into atrial fibrillation.  She failed both Multaq and propafenone.  She was referred to Dr Rayann Heman for evaluation for atrial fibrillation ablation.  Pre-TEE ablation showed severe TR with PA systolic pressure 60 mmHg as well as moderate MR.  Given the valvular disease and pulmonary hypertension, it was decided to postpone ablation, and she was referred to this office.  I did a right heart cath given valvular disease and pulmonary hypertension on echo.  This showed mildly elevated right and left heart filling pressures and mild pulmonary venous hypertension.  There were prominent V-waves in the PCWP tracing.  After RHC, I started her on Lasix 20 daily but she stopped it (made her feel dizzy).  She seems to be doing well symptomatically.  She is mildly short of breath walking up steps.  She is able to walk on flat ground and do housework without dyspnea.  No lightheadedness currently.  No orthopnea/PND.  She does not feel palpitations.    ECG: atrial fibrillation  Labs (7/16): K 3.9, creatinine 1.03, HCT 39.6 Labs (8/16): K 4.4, creatinine 1.0, RF negative, BNP 238, anti-centromere Ab negative  PMH: 1. Atrial fibrillation: Chronic.  Began about a year ago.  DCCV in 5/16 failed.  DCCV in 6/16 held NSR for about an hour then failed.  Has failed both Multaq and propafenone.  Considered for ablation but decided against due to valvular disease/pulmonary hypertension.   2. Valvular heart disease/diastolic CHF: TEE (0/97) with EF 60%, moderate MR, normal RV size and systolic function, PASP 60 mmHg with severe TR.  RHC (8/16) with mean RA 9, PA 41/18/mean 28, mean PCWP 21 with v-waves to 32, CI 2.15, PVR 2.0 WU.   3. GERD 4. COPD: Moderate by PFTs.  PFTs (8/16) with FVC 104%, FEV1 80%, ratio 66%, TLC 96%, DLCO 95%.  5. Psoriatic arthritis: Quiescent currently.   SH: Lives in [place], works as Product manager at [employer], married, prior smoker, occasional ETOH.  Retired Marine scientist.    FH: Mother with MI in her 72s, father with CVA in his 35s.   ROS: All systems reviewed and negative except as per HPI.   Current Outpatient Prescriptions  Medication Sig Dispense Refill  . apixaban (ELIQUIS) 5 MG TABS tablet Take 5 mg by mouth 2 (two) times daily.    . Biotin 5000 MCG TABS Take 5,000 mcg by mouth daily.    . Calcium Polycarbophil (FIBER-CAPS PO) Take 5 capsules by mouth daily.    . Cholecalciferol (VITAMIN D-3) 1000 UNITS CAPS Take 1,000 Units by mouth daily.     . cimetidine (TAGAMET) 200 MG tablet Take 200 mg by mouth daily.    . Coenzyme Q10 (CO Q 10) 100 MG CAPS Take 100 mg by mouth daily.     . metoprolol succinate (TOPROL-XL) 50 MG 24 hr tablet Take 50 mg by mouth daily.     . Multiple Vitamins-Minerals (ONE-A-DAY WOMENS 50+ ADVANTAGE PO) Take  1 tablet by mouth daily.    . Omega-3 Krill Oil 500 MG CAPS Take 500 mg by mouth daily.    Marland Kitchen OVER THE COUNTER MEDICATION Take 1 capsule by mouth daily. "[place] Magnesium"    . OVER THE COUNTER MEDICATION Take 2 each by mouth at bedtime. "Fiber Well Gummies"    . Probiotic Product (PROBIOTIC PO) Take 1 capsule by mouth daily.    Marland Kitchen rOPINIRole (REQUIP) 1 MG tablet Take 1 mg by mouth daily.     . Sennosides 25 MG TABS Take 25 mg by mouth daily.    Marland Kitchen triamcinolone (NASACORT ALLERGY 24HR) 55 MCG/ACT AERO nasal inhaler Place 2 sprays into the nose daily as needed (for allergies).    . furosemide (LASIX) 20 MG tablet  Take 0.5 tablets (10 mg total) by mouth daily. 90 tablet 3  . potassium chloride SA (K-DUR,KLOR-CON) 20 MEQ tablet Take 0.5 tablets (10 mEq total) by mouth daily. 90 tablet 3   No current facility-administered medications for this encounter.   Facility-Administered Medications Ordered in Other Encounters  Medication Dose Route Frequency Provider Last Rate Last Dose  . propofol (DIPRIVAN) 1000 MG/100ML infusion  10 mcg/kg/min Intravenous Titrated Doctor Chlconversion, MD       BP 96/52 mmHg  Pulse 88  Wt 135 lb (61.236 kg)  SpO2 99% General: NAD Neck: No JVD, no thyromegaly or thyroid nodule.  Lungs: Clear to auscultation bilaterally with normal respiratory effort. CV: Nondisplaced PMI.  Heart irregular S1/S2, no S3/S4, 1/6 HSM LLSB.  No peripheral edema.  No carotid bruit.  Normal pedal pulses.  Abdomen: Soft, nontender, no hepatosplenomegaly, no distention.  Skin: Intact without lesions or rashes.  Neurologic: Alert and oriented x 3.  Psych: Normal affect. Extremities: No clubbing or cyanosis.  HEENT: Normal.   Assessment/Plan: 1. Pulmonary hypertension: Patient was noted by TEE to have moderate pulmonary hypertension.  RHC showed primarily pulmonary venous hypertension with PVR 2 WU (pulmonary hypertension due to left heart disease). 2. Atrial fibrillation: Chronic.  Would be reasonable to attempt conversion to NSR using Tikosyn.  Valvular disease seems moderate (see #3 below) and may be improved by resumption of MR. .   - Continue Eliquis and Toprol XL.  - I will arrange for Tikosyn admission => will refer to pharmacy clinic to initiate.  3. Valvular heart disease: I reviewed the prior TEE today.  Images are somewhat limited, but MR and TR both appear moderate from what I can see.  Interestingly however, v-waves were prominent on the PCWP tracing at the time of cath.  It is possible that resumption of NSR will decrease regurgitation.  - As above, I would like her to get back into NSR  on Tikosyn.  - I will order a TTE for more thorough evaluation of MV and TV.  4. Chronic diastolic CHF: Elevated BNP and filling pressures on RHC.  She did not tolerate Lasix 20 daily.  I would like her to go ahead and try Lasix 10 daily with KCl 10 daily.  - BMET in 10 days.    Loralie Champagne 06/14/2015 11:36 PM

## 2015-06-17 ENCOUNTER — Encounter: Payer: Self-pay | Admitting: Internal Medicine

## 2015-06-28 ENCOUNTER — Ambulatory Visit (INDEPENDENT_AMBULATORY_CARE_PROVIDER_SITE_OTHER): Payer: Medicare HMO | Admitting: Pharmacist

## 2015-06-28 DIAGNOSIS — I4892 Unspecified atrial flutter: Secondary | ICD-10-CM

## 2015-06-28 NOTE — Progress Notes (Signed)
Patient ID: Cynthia Potts, female   DOB: 1941-04-23, 74 y.o.   MRN: [id number] PCP: Benita Stabile Cardiologists- Dr. Thompson Grayer and Dr. Loralie Champagne  Cynthia Potts is a 74 yo pt of Drs. Allred and Aundra Dubin with history of chronic atrial fibrillation and valvular heart disease/pulmonary hypertension by TEE. She has been in atrial fibrillation for about a year.  In 4/16, she developed chest heaviness and there was concern for diastolic CHF.  At that point, it was decided to try to cardiovert her.  DCCV failed in 5/16.  DCCV was attempted again in 6/16.  She convered to NSR briefly then went back into atrial fibrillation.  She failed both Multaq (stopped 5/16) and propafenone.  She was referred to Dr Rayann Heman for evaluation for atrial fibrillation ablation.  Pre-TEE ablation showed severe TR with PA systolic pressure 60 mmHg as well as moderate MR.  Given the valvular disease and pulmonary hypertension, it was decided to postpone ablation, and she was referred to the HF clinic.  She had a right heart cath on 8/29 that showed mildly elevated right and left heart filling pressures and mild pulmonary venous hypertension.  She had a follow up with Dr. Aundra Dubin on 9/6.  At that time, it was felt that restoring NSR could help with her valvular disease.  Plan was made to initiate Tikosyn once patient had been properly anticoagulated again after cath.   Pt is accompanied with husband today.  She states she is feeling ok and does not notice when she is atrial fibrillation at the moment. Discussed potential side effects of Tikosyn including QTc prolongation.  They are aware of the importance of compliance and to call the office if patient misses more than 2 doses in a row.  Medication list reviewed.  She is not currently taking any QTc prolongating or containdicated medications.  She has been anticoagulated with Eliquis x 3 weeks with no interruptions.    EKG reviewed by Dr. Lovena Le.  Atrial fibrillation with vent rate of 79  bpm.  QTc 470 msec.   Current Outpatient Prescriptions  Medication Sig Dispense Refill  . apixaban (ELIQUIS) 5 MG TABS tablet Take 5 mg by mouth 2 (two) times daily.    . Biotin 5000 MCG TABS Take 5,000 mcg by mouth daily.    . Cholecalciferol (VITAMIN D-3) 1000 UNITS CAPS Take 1,000 Units by mouth daily.     . Coenzyme Q10 (CO Q 10) 100 MG CAPS Take 100 mg by mouth daily.     . furosemide (LASIX) 20 MG tablet Take 0.5 tablets (10 mg total) by mouth daily. 90 tablet 3  . metoprolol succinate (TOPROL-XL) 50 MG 24 hr tablet Take 50 mg by mouth daily.     . Multiple Vitamins-Minerals (ONE-A-DAY WOMENS 50+ ADVANTAGE PO) Take 1 tablet by mouth daily.    . Omega-3 Krill Oil 500 MG CAPS Take 500 mg by mouth daily.    Marland Kitchen OVER THE COUNTER MEDICATION Take 1 capsule by mouth daily. "[place] Magnesium"    . OVER THE COUNTER MEDICATION Take 2 each by mouth at bedtime. "Fiber Well Gummies"    . potassium chloride SA (K-DUR,KLOR-CON) 20 MEQ tablet Take 0.5 tablets (10 mEq total) by mouth daily. 90 tablet 3  . Probiotic Product (PROBIOTIC PO) Take 1 capsule by mouth daily.    Marland Kitchen rOPINIRole (REQUIP) 1 MG tablet Take 1 mg by mouth daily.     . Sennosides 25 MG TABS Take 25 mg by mouth daily.    Marland Kitchen  triamcinolone (NASACORT ALLERGY 24HR) 55 MCG/ACT AERO nasal inhaler Place 2 sprays into the nose daily as needed (for allergies).     No current facility-administered medications for this visit.    Assessment and Plan 1.  Atrial Fibrillation- Per Dr. Lovena Le, QTc too long to start Tikosyn.  Reviewed EKG while in NSR and QTc was still >440 msec.  Pt aware that she will not be admitted today.  Will send note to Dr. Rayann Heman and Aundra Dubin to determine next steps.

## 2015-06-29 ENCOUNTER — Other Ambulatory Visit: Payer: Self-pay

## 2015-06-29 ENCOUNTER — Ambulatory Visit (HOSPITAL_COMMUNITY): Payer: Medicare HMO | Attending: Internal Medicine

## 2015-06-29 ENCOUNTER — Other Ambulatory Visit (HOSPITAL_COMMUNITY): Payer: Medicare HMO

## 2015-06-29 ENCOUNTER — Other Ambulatory Visit (INDEPENDENT_AMBULATORY_CARE_PROVIDER_SITE_OTHER): Payer: Medicare HMO

## 2015-06-29 ENCOUNTER — Other Ambulatory Visit: Payer: Self-pay | Admitting: Pharmacist

## 2015-06-29 DIAGNOSIS — I340 Nonrheumatic mitral (valve) insufficiency: Secondary | ICD-10-CM | POA: Insufficient documentation

## 2015-06-29 DIAGNOSIS — I071 Rheumatic tricuspid insufficiency: Secondary | ICD-10-CM | POA: Insufficient documentation

## 2015-06-29 DIAGNOSIS — I480 Paroxysmal atrial fibrillation: Secondary | ICD-10-CM | POA: Diagnosis not present

## 2015-06-29 DIAGNOSIS — I517 Cardiomegaly: Secondary | ICD-10-CM | POA: Diagnosis not present

## 2015-06-29 DIAGNOSIS — I77810 Thoracic aortic ectasia: Secondary | ICD-10-CM | POA: Diagnosis not present

## 2015-06-29 DIAGNOSIS — I5032 Chronic diastolic (congestive) heart failure: Secondary | ICD-10-CM

## 2015-06-29 LAB — BASIC METABOLIC PANEL
BUN: 24 mg/dL — AB (ref 6–23)
CHLORIDE: 101 meq/L (ref 96–112)
CO2: 34 meq/L — AB (ref 19–32)
CREATININE: 1.11 mg/dL (ref 0.40–1.20)
Calcium: 9.3 mg/dL (ref 8.4–10.5)
GFR: 51.34 mL/min — ABNORMAL LOW (ref >60.00)
Glucose, Bld: 81 mg/dL (ref 70–99)
POTASSIUM: 3.8 meq/L (ref 3.5–5.1)
Sodium: 140 mEq/L (ref 135–145)

## 2015-06-30 ENCOUNTER — Encounter (HOSPITAL_COMMUNITY): Payer: Medicare HMO

## 2015-07-06 ENCOUNTER — Encounter: Payer: Self-pay | Admitting: Internal Medicine

## 2015-07-06 ENCOUNTER — Ambulatory Visit (INDEPENDENT_AMBULATORY_CARE_PROVIDER_SITE_OTHER): Payer: Medicare HMO | Admitting: Internal Medicine

## 2015-07-06 VITALS — BP 100/74 | HR 84 | Ht 63.0 in | Wt 135.0 lb

## 2015-07-06 DIAGNOSIS — I481 Persistent atrial fibrillation: Secondary | ICD-10-CM | POA: Diagnosis not present

## 2015-07-06 DIAGNOSIS — I071 Rheumatic tricuspid insufficiency: Secondary | ICD-10-CM | POA: Diagnosis not present

## 2015-07-06 DIAGNOSIS — I4819 Other persistent atrial fibrillation: Secondary | ICD-10-CM

## 2015-07-06 NOTE — Progress Notes (Signed)
Electrophysiology Office Note   Date:  07/06/2015   ID:  Cynthia Potts, DOB 09/05/1941, MRN [id number]  PCP:  Albina Billet, MD  Cardiologist:  Dr Nehemiah Massed Primary Electrophysiologist: Thompson Grayer, MD    Chief Complaint  Patient presents with  . PAF     History of Present Illness: Cynthia Potts is a 74 y.o. female who presents today for electrophysiology evaluation.   She has had atrial fibrillation for about a year. This was initially diagnosed after presenting with CHF symptoms. She has required cardioversion previously which was not successful.  She has failed medical therapy with propafenone and also multaq.  She was evaluated by me with plans for ablation.  TEE revealed advanced TR and MR with biatrial enlargement which were felt to be not favorable for ablation at the time.  She has been treated aggressively with medical therapy by Dr Aundra Dubin and has undergone RHC recently.   She has been treated with rate control and currently says that she feels "[place]".  Exercise tolerance is at baseline and CHF is well controlled.   Today, she denies symptoms of palpitations, chest pain, orthopnea, PND, lower extremity edema, claudication, dizziness, presyncope, syncope, bleeding, or neurologic sequela. The patient is tolerating medications without difficulties and is otherwise without complaint today.    Past Medical History  Diagnosis Date  . GERD (gastroesophageal reflux disease)   . Persistent atrial fibrillation   . Moderate tricuspid regurgitation   . Moderate mitral regurgitation   . Biatrial enlargement    Past Surgical History  Procedure Laterality Date  . None    . Tee without cardioversion N/A 05/12/2015    Procedure: TRANSESOPHAGEAL ECHOCARDIOGRAM (TEE);  Surgeon: Josue Hector, MD;  Location: [facility];  Service: Cardiovascular;  Laterality: N/A;  . Cardiac catheterization N/A 06/06/2015    Procedure: Right Heart Cath;  Surgeon: Larey Dresser, MD;  Location: [facility];  Service: Cardiovascular;  Laterality: N/A;  . Electrophysiologic study N/A 02/28/2015    Procedure: Cardioversion;  Surgeon: Corey Skains, MD;  Location: [facility] ORS;  Service: Cardiovascular;  Laterality: N/A;  . Electrophysiologic study N/A 03/24/2015    Procedure: Cardioversion;  Surgeon: Corey Skains, MD;  Location: [facility] ORS;  Service: Cardiovascular;  Laterality: N/A;     Current Outpatient Prescriptions  Medication Sig Dispense Refill  . apixaban (ELIQUIS) 5 MG TABS tablet Take 5 mg by mouth 2 (two) times daily.    . Biotin 5000 MCG TABS Take 5,000 mcg by mouth daily.    . Cholecalciferol (VITAMIN D-3) 1000 UNITS CAPS Take 1,000 Units by mouth daily.     . Coenzyme Q10 (CO Q 10) 100 MG CAPS Take 100 mg by mouth daily.     . furosemide (LASIX) 20 MG tablet Take 0.5 tablets (10 mg total) by mouth daily. 90 tablet 3  . metoprolol succinate (TOPROL-XL) 50 MG 24 hr tablet Take 50 mg by mouth daily.     . Multiple Vitamins-Minerals (ONE-A-DAY WOMENS 50+ ADVANTAGE PO) Take 1 tablet by mouth daily.    . Omega-3 Krill Oil 500 MG CAPS Take 1,000 mg by mouth daily.     Marland Kitchen OVER THE COUNTER MEDICATION Take 2 capsules by mouth daily. "[place] Magnesium"    . OVER THE COUNTER MEDICATION Take 2 each by mouth at bedtime. "Fiber Well Gummies"    . potassium chloride (K-DUR) 10 MEQ tablet Take 1 tablet by mouth daily.    . Probiotic Product (PROBIOTIC PO) Take 1  capsule by mouth daily.    Marland Kitchen rOPINIRole (REQUIP) 1 MG tablet Take 1 mg by mouth daily.     . Sennosides 25 MG TABS Take 25 mg by mouth daily.    Marland Kitchen triamcinolone (NASACORT ALLERGY 24HR) 55 MCG/ACT AERO nasal inhaler Place 2 sprays into the nose daily as needed (for allergies).     No current facility-administered medications for this visit.    Allergies:   Review of patient's allergies indicates no known allergies.   Social History:  The patient  reports that she has quit smoking. She does not have any smokeless tobacco  history on file. She reports that she drinks about 0.6 oz of alcohol per week. She reports that she does not use illicit drugs.   Family History:  The patient's  family history includes Diabetes in her mother; Stroke (age of onset: 55) in her father.    ROS:  Please see the history of present illness.   All other systems are reviewed and negative.    PHYSICAL EXAM: VS:  BP 100/74 mmHg  Pulse 84  Ht 5\' 3"  (1.6 m)  Wt 135 lb (61.236 kg)  BMI 23.92 kg/m2 , BMI Body mass index is 23.92 kg/(m^2). GEN: thin, in no acute distress HEENT: normal Neck: no JVD, carotid bruits, or masses Cardiac: iRRR; 2/6 SEM LLSB,no edema  Respiratory:  clear to auscultation bilaterally, normal work of breathing GI: soft, nontender, nondistended, + BS MS: no deformity or atrophy Skin: warm and dry  Neuro:  Strength and sensation are intact Psych: euthymic mood, full affect  EKG:  EKG is ordered today. The ekg ordered today shows afib, V rate 84 bpm,    Recent Labs: 02/05/2015: SGPT (ALT) 21 06/02/2015: B Natriuretic Peptide 237.5* 06/06/2015: Hemoglobin 13.4; Platelets 151 06/29/2015: BUN 24*; Creatinine, Ser 1.11; Potassium 3.8; Sodium 140    Lipid Panel  No results found for: CHOL, TRIG, HDL, CHOLHDL, VLDL, LDLCALC, LDLDIRECT   Wt Readings from Last 3 Encounters:  07/06/15 135 lb (61.236 kg)  06/14/15 135 lb (61.236 kg)  06/06/15 132 lb (59.875 kg)      Other studies Reviewed: Additional studies/ records that were reviewed today include: Dr Oleh Genin notes, prior echo  Review of the above records today demonstrates: echo shows normal EF,  moderate MR,  moderate TR   ASSESSMENT AND PLAN:  1.  Persistent atrial fibrillation The patient has minimally symptomatic persistent atrial fibrillation.  She has failed medical therapy with propafenone and multaq.  She is currently doing well with rate control.  Today, we discussed a long term strategy of rate control vs amiodarone or ablation.  At this  time, she is clear that she would prefer a conservative rate control strategy. Chads2vasc score is at least 2.  She is anticoagulated with eliquis.  Today, I have spent 25 minutes with the patient discussing afib management.  More than 50% of the visit time today was spent on this issue.   Current medicines are reviewed at length with the patient today.   The patient does not have concerns regarding her medicines.  The following changes were made today:  none  Return in 3 months for additional evaluation  Signed, Thompson Grayer, MD  07/06/2015 9:26 PM     [address] [place] [address] [phone] (office) [phone] (fax)

## 2015-07-06 NOTE — Patient Instructions (Signed)
Medication Instructions:  Your physician recommends that you continue on your current medications as directed. Please refer to the Current Medication list given to you today.   Labwork: N/A  Testing/Procedures: N/A   Follow-Up: Your physician wants you to follow-up in: 3 months with Dr. Rayann Heman  Any Other Special Instructions Will Be Listed Below (If Applicable).

## 2015-08-05 ENCOUNTER — Encounter (HOSPITAL_BASED_OUTPATIENT_CLINIC_OR_DEPARTMENT_OTHER): Payer: Medicare HMO

## 2015-08-23 ENCOUNTER — Ambulatory Visit: Payer: Medicare HMO | Admitting: Certified Registered Nurse Anesthetist

## 2015-08-23 ENCOUNTER — Ambulatory Visit
Admission: RE | Admit: 2015-08-23 | Discharge: 2015-08-23 | Disposition: A | Discharge status: Home / Self Care | Payer: Medicare HMO | Source: Ambulatory Visit | Attending: Ophthalmology | Admitting: Ophthalmology

## 2015-08-23 ENCOUNTER — Encounter
Admission: RE | Disposition: A | Discharge status: Home / Self Care | Payer: Self-pay | Source: Ambulatory Visit | Attending: Ophthalmology

## 2015-08-23 ENCOUNTER — Encounter: Payer: Self-pay | Admitting: *Deleted

## 2015-08-23 DIAGNOSIS — K219 Gastro-esophageal reflux disease without esophagitis: Secondary | ICD-10-CM | POA: Diagnosis not present

## 2015-08-23 DIAGNOSIS — R011 Cardiac murmur, unspecified: Secondary | ICD-10-CM | POA: Diagnosis not present

## 2015-08-23 DIAGNOSIS — Z87891 Personal history of nicotine dependence: Secondary | ICD-10-CM | POA: Diagnosis not present

## 2015-08-23 DIAGNOSIS — I509 Heart failure, unspecified: Secondary | ICD-10-CM | POA: Insufficient documentation

## 2015-08-23 DIAGNOSIS — I361 Nonrheumatic tricuspid (valve) insufficiency: Secondary | ICD-10-CM | POA: Insufficient documentation

## 2015-08-23 DIAGNOSIS — H2512 Age-related nuclear cataract, left eye: Secondary | ICD-10-CM | POA: Insufficient documentation

## 2015-08-23 DIAGNOSIS — I481 Persistent atrial fibrillation: Secondary | ICD-10-CM | POA: Diagnosis not present

## 2015-08-23 HISTORY — PX: CATARACT EXTRACTION W/PHACO: SHX586

## 2015-08-23 SURGERY — PHACOEMULSIFICATION, CATARACT, WITH IOL INSERTION
Anesthesia: Monitor Anesthesia Care | Site: Eye | Laterality: Left | Wound class: Clean

## 2015-08-23 MED ORDER — ARMC OPHTHALMIC DILATING GEL
OPHTHALMIC | Status: AC
  Filled 2015-08-23: qty 0.25

## 2015-08-23 MED ORDER — CEFUROXIME OPHTHALMIC INJECTION 1 MG/0.1 ML
INJECTION | OPHTHALMIC | Status: DC | PRN
  Administered 2015-08-23: .1 mL via INTRACAMERAL

## 2015-08-23 MED ORDER — FENTANYL CITRATE (PF) 100 MCG/2ML IJ SOLN: 25.0000 ug | INTRAMUSCULAR | Status: DC | PRN

## 2015-08-23 MED ORDER — EPINEPHRINE HCL 1 MG/ML IJ SOLN
INTRAMUSCULAR | Status: AC
  Filled 2015-08-23: qty 1

## 2015-08-23 MED ORDER — MIDAZOLAM HCL 2 MG/2ML IJ SOLN
INTRAMUSCULAR | Status: DC | PRN
  Administered 2015-08-23: 0.5 mg via INTRAVENOUS

## 2015-08-23 MED ORDER — MOXIFLOXACIN HCL 0.5 % OP SOLN
OPHTHALMIC | Status: DC | PRN
  Administered 2015-08-23: 1 [drp] via OPHTHALMIC

## 2015-08-23 MED ORDER — MOXIFLOXACIN HCL 0.5 % OP SOLN
OPHTHALMIC | Status: AC
  Filled 2015-08-23: qty 3

## 2015-08-23 MED ORDER — ONDANSETRON HCL 4 MG/2ML IJ SOLN: 4.0000 mg | Freq: Once | INTRAMUSCULAR | Status: DC | PRN

## 2015-08-23 MED ORDER — POVIDONE-IODINE 5 % OP SOLN: 1.0000 "application " | Freq: Once | OPHTHALMIC | Status: DC

## 2015-08-23 MED ORDER — CEFUROXIME OPHTHALMIC INJECTION 1 MG/0.1 ML
INJECTION | OPHTHALMIC | Status: AC
Start: 2015-08-23 — End: 2015-08-23
  Filled 2015-08-23: qty 0.1

## 2015-08-23 MED ORDER — TETRACAINE HCL 0.5 % OP SOLN
OPHTHALMIC | Status: AC
  Filled 2015-08-23: qty 2

## 2015-08-23 MED ORDER — ARMC OPHTHALMIC DILATING GEL
1.0000 | OPHTHALMIC | Status: AC | PRN
Start: 2015-08-23 — End: 2015-08-24

## 2015-08-23 MED ORDER — SODIUM CHLORIDE 0.9 % IV SOLN
INTRAVENOUS | Status: DC
  Administered 2015-08-23: 08:00:00 via INTRAVENOUS

## 2015-08-23 MED ORDER — TETRACAINE HCL 0.5 % OP SOLN
1.0000 [drp] | Freq: Once | OPHTHALMIC | Status: AC
  Administered 2015-08-23: 1 [drp] via OPHTHALMIC

## 2015-08-23 MED ORDER — FENTANYL CITRATE (PF) 100 MCG/2ML IJ SOLN
INTRAMUSCULAR | Status: DC | PRN
  Administered 2015-08-23: 25 ug via INTRAVENOUS

## 2015-08-23 MED ORDER — NA CHONDROIT SULF-NA HYALURON 40-17 MG/ML IO SOLN
INTRAOCULAR | Status: AC
Start: 2015-08-23 — End: 2015-08-23
  Filled 2015-08-23: qty 1

## 2015-08-23 MED ORDER — CARBACHOL 0.01 % IO SOLN
INTRAOCULAR | Status: DC | PRN
  Administered 2015-08-23: .5 mL via INTRAOCULAR

## 2015-08-23 MED ORDER — NA CHONDROIT SULF-NA HYALURON 40-17 MG/ML IO SOLN
INTRAOCULAR | Status: DC | PRN
  Administered 2015-08-23: 1 mL via INTRAOCULAR

## 2015-08-23 MED ORDER — EPINEPHRINE HCL 1 MG/ML IJ SOLN
INTRAMUSCULAR | Status: DC | PRN
  Administered 2015-08-23: 1 mL via OPHTHALMIC

## 2015-08-23 MED ORDER — POVIDONE-IODINE 5 % OP SOLN
OPHTHALMIC | Status: AC
  Filled 2015-08-23: qty 30

## 2015-08-23 MED ORDER — MOXIFLOXACIN HCL 0.5 % OP SOLN: 1.0000 [drp] | OPHTHALMIC | Status: AC | PRN

## 2015-08-23 SURGICAL SUPPLY — 23 items
CANNULA ANT/CHMB 27GA (MISCELLANEOUS) ×3 IMPLANT
CUP MEDICINE 2OZ PLAST GRAD ST (MISCELLANEOUS) ×3 IMPLANT
GLOVE BIO SURGEON STRL SZ8 (GLOVE) ×3 IMPLANT
GLOVE BIOGEL M 6.5 STRL (GLOVE) ×3 IMPLANT
GLOVE SURG LX 8.0 MICRO (GLOVE) ×2
GLOVE SURG LX STRL 8.0 MICRO (GLOVE) ×1 IMPLANT
GOWN STRL REUS W/ TWL LRG LVL3 (GOWN DISPOSABLE) ×2 IMPLANT
GOWN STRL REUS W/TWL LRG LVL3 (GOWN DISPOSABLE) ×4
LENS IOL SYMFON TORIC 150 22.0 (Intraocular Lens) ×1 IMPLANT
LENS IOL SYMFONY TORIC 22.0 (Intraocular Lens) ×2 IMPLANT
LENS IOL SYMFONY TRC 150 22.0 (Intraocular Lens) ×1 IMPLANT
PACK CATARACT (MISCELLANEOUS) ×3 IMPLANT
PACK CATARACT BRASINGTON LX (MISCELLANEOUS) ×3 IMPLANT
PACK EYE AFTER SURG (MISCELLANEOUS) ×3 IMPLANT
SOL BSS BAG (MISCELLANEOUS) ×3
SOL PREP PVP 2OZ (MISCELLANEOUS) ×3
SOLUTION BSS BAG (MISCELLANEOUS) ×1 IMPLANT
SOLUTION PREP PVP 2OZ (MISCELLANEOUS) ×1 IMPLANT
SYR 3ML LL SCALE MARK (SYRINGE) ×3 IMPLANT
SYR 5ML LL (SYRINGE) ×3 IMPLANT
SYR TB 1ML 27GX1/2 LL (SYRINGE) ×3 IMPLANT
WATER STERILE IRR 1000ML POUR (IV SOLUTION) ×3 IMPLANT
WIPE NON LINTING 3.25X3.25 (MISCELLANEOUS) ×3 IMPLANT

## 2015-08-23 NOTE — H&P (Signed)
  All labs reviewed. Abnormal studies sent to patients PCP when indicated.  Previous H&P reviewed, patient examined, there are NO CHANGES.  Cynthia Potts LOUIS11/15/20168:43 AM

## 2015-08-23 NOTE — Transfer of Care (Signed)
Immediate Anesthesia Transfer of Care Note  Patient: Cynthia Potts  Procedure(s) Performed: Procedure(s) with comments: CATARACT EXTRACTION PHACO AND INTRAOCULAR LENS PLACEMENT (IOC) (Left) - [place] 01:07 AP% 22.9 CDE 15.42 fluid pack lot [id number]  Patient Location: Short Stay  Anesthesia Type:MAC  Level of Consciousness: awake and alert   Airway & Oxygen Therapy: Patient Spontanous Breathing  Post-op Assessment: Report given to RN  Post vital signs: Reviewed  Last Vitals:  Filed Vitals:   08/23/15 0911  BP: 103/69  Pulse: 71  Temp: 36.7 C  Resp: 16    Complications: No apparent anesthesia complications

## 2015-08-23 NOTE — Anesthesia Preprocedure Evaluation (Signed)
Anesthesia Evaluation  Patient identified by MRN, date of birth, ID band Patient awake    Reviewed: Allergy & Precautions, NPO status , Patient's Chart, lab work & pertinent test results, reviewed documented beta blocker date and time   Airway Mallampati: II  TM Distance: >3 FB     Dental  (+) Caps   Pulmonary former smoker,    Pulmonary exam normal breath sounds clear to auscultation       Cardiovascular +CHF  Normal cardiovascular exam+ dysrhythmias Atrial Fibrillation + Valvular Problems/Murmurs MR   Tricuspid regurgitation   Neuro/Psych negative psych ROS   GI/Hepatic Neg liver ROS, GERD  Medicated and Controlled,  Endo/Other  negative endocrine ROS  Renal/GU negative Renal ROS     Musculoskeletal negative musculoskeletal ROS (+)   Abdominal Normal abdominal exam  (+)   Peds  Hematology   Anesthesia Other Findings   Reproductive/Obstetrics                             Anesthesia Physical Anesthesia Plan  ASA: III  Anesthesia Plan: MAC   Post-op Pain Management:    Induction: Intravenous  Airway Management Planned: Nasal Cannula  Additional Equipment:   Intra-op Plan:   Post-operative Plan:   Informed Consent: I have reviewed the patients History and Physical, chart, labs and discussed the procedure including the risks, benefits and alternatives for the proposed anesthesia with the patient or authorized representative who has indicated his/her understanding and acceptance.   Dental advisory given  Plan Discussed with: CRNA and Surgeon  Anesthesia Plan Comments:         Anesthesia Quick Evaluation

## 2015-08-23 NOTE — Op Note (Signed)
PREOPERATIVE DIAGNOSIS:  Nuclear sclerotic cataract of the left eye.   POSTOPERATIVE DIAGNOSIS:  nuclear sclerotic cataract left eye   OPERATIVE PROCEDURE:  Procedure(s): CATARACT EXTRACTION PHACO AND INTRAOCULAR LENS PLACEMENT (IOC)   SURGEON:  Birder Robson, MD.   ANESTHESIA: 1.      Managed anesthesia care. 2.      Topical tetracaine drops followed by 2% Xylocaine jelly applied in the preoperative holding area.  Anesthesiologist: Alvin Critchley, MD CRNA: Rolla Plate, CRNA; Bernardo Heater, CRNA   COMPLICATIONS:  None.   TECHNIQUE:   Stop and chop    DESCRIPTION OF PROCEDURE:  The patient was examined and consented in the preoperative holding area where the aforementioned topical anesthesia was applied to the left eye.  The patient was brought back to the Operating Room where he was sat upright on the gurney and given a target to fixate upon while the eye was marked at the 3:00 and 9:00 position.  The patient was then reclined on the operating table.  The eye was prepped and draped in the usual sterile ophthalmic fashion and a lid speculum was placed. A paracentesis was created with the side port blade and the anterior chamber was filled with viscoelastic. A near clear corneal incision was performed with the steel keratome. A continuous curvilinear capsulorrhexis was performed with a cystotome followed by the capsulorrhexis forceps. Hydrodissection and hydrodelineation were carried out with BSS on a blunt cannula. The lens was removed in a stop and chop technique and the remaining cortical material was removed with the irrigation-aspiration handpiece. The eye was inflated with viscoelastic and the 22 D ZXT lens  was placed in the eye and rotated to within a few degrees of the predetermined orientation.  The remaining viscoelastic was removed from the eye.  The [place] hook was used to rotate the toric lens into its final resting place at 118 degrees.  0.1 mL of cefuroxime concentration 10  mg/mL was placed in the anterior chamber. The eye was inflated to a physiologic pressure and found to be watertight.  The eye was dressed with Vigamox. The patient was given protective glasses to wear throughout the day and a shield with which to sleep tonight. The patient was also given drops with which to begin a drop regimen today and will follow-up with me in one day.  Implant Name Type Inv. Item Serial No. Manufacturer Lot No. LRB No. Used  tecnis symphony toric lens     [id number]     Left 1   Procedure(s) with comments: CATARACT EXTRACTION PHACO AND INTRAOCULAR LENS PLACEMENT (IOC) (Left) - [place] 01:07 AP% 22.9 CDE 15.42 fluid pack lot [id number]  Electronically signed: [place] 08/23/2015 9:15 AM

## 2015-08-23 NOTE — Discharge Instructions (Signed)
AMBULATORY SURGERY  DISCHARGE INSTRUCTIONS   1) The drugs that you were given will stay in your system until tomorrow so for the next 24 hours you should not:  A) Drive an automobile B) Make any legal decisions C) Drink any alcoholic beverage   2) You may resume regular meals tomorrow.  Today it is better to start with liquids and gradually work up to solid foods.  You may eat anything you prefer, but it is better to start with liquids, then soup and crackers, and gradually work up to solid foods.   3) Please notify your doctor immediately if you have any unusual bleeding, trouble breathing, redness and pain at the surgery site, drainage, fever, or pain not relieved by medication.    4) Additional Instructions:    Eye Surgery Discharge Instructions  Expect mild scratchy sensation or mild soreness. DO NOT RUB YOUR EYE!  The day of surgery:  Minimal physical activity, but bed rest is not required  No reading, computer work, or close hand work  No bending, lifting, or straining.  May watch TV  For 24 hours:  No driving, legal decisions, or alcoholic beverages  Safety precautions  Eat anything you prefer: It is better to start with liquids, then soup then solid foods.  _____ Eye patch should be worn until postoperative exam tomorrow.  ____ Solar shield eyeglasses should be worn for comfort in the sunlight/patch while sleeping  Resume all regular medications including aspirin or Coumadin if these were discontinued prior to surgery. You may shower, bathe, shave, or wash your hair. Tylenol may be taken for mild discomfort.  Call your doctor if you experience significant pain, nausea, or vomiting, fever > 101 or other signs of infection. [phone] or [phone] Specific instructions:  Follow-up Information    Follow up with PORFILIO,WILLIAM LOUIS, MD In 1 day.   Specialty:  Ophthalmology   Why:  November 16 at 9:10am   Contact information:   [address] [place] [address] [phone]         Please contact your physician with any problems or Same Day Surgery at [phone], Monday through Friday 6 am to 4 pm, or [place] at [facility] number at [phone].

## 2015-08-23 NOTE — Anesthesia Postprocedure Evaluation (Signed)
  Anesthesia Post-op Note  Patient: Cynthia Potts  Procedure(s) Performed: Procedure(s) with comments: CATARACT EXTRACTION PHACO AND INTRAOCULAR LENS PLACEMENT (IOC) (Left) - [place] 01:07 AP% 22.9 CDE 15.42 fluid pack lot [id number]  Anesthesia type:MAC  Patient location: sds  Post pain: Pain level controlled  Post assessment: Post-op Vital signs reviewed, Patient's Cardiovascular Status Stable, Respiratory Function Stable, Patent Airway and No signs of Nausea or vomiting  Post vital signs: Reviewed and stable  Last Vitals:  Filed Vitals:   08/23/15 0911  BP: 103/69  Pulse: 71  Temp: 36.7 C  Resp: 16    Level of consciousness: awake, alert  and patient cooperative  Complications: No apparent anesthesia complications

## 2015-09-12 DIAGNOSIS — R079 Chest pain, unspecified: Secondary | ICD-10-CM | POA: Insufficient documentation

## 2015-10-05 ENCOUNTER — Ambulatory Visit (INDEPENDENT_AMBULATORY_CARE_PROVIDER_SITE_OTHER): Payer: Medicare HMO | Admitting: Obstetrics and Gynecology

## 2015-10-05 ENCOUNTER — Encounter: Payer: Self-pay | Admitting: Obstetrics and Gynecology

## 2015-10-05 VITALS — BP 86/50 | HR 77 | Ht 63.0 in | Wt 137.9 lb

## 2015-10-05 DIAGNOSIS — N898 Other specified noninflammatory disorders of vagina: Secondary | ICD-10-CM

## 2015-10-05 DIAGNOSIS — L298 Other pruritus: Secondary | ICD-10-CM

## 2015-10-05 NOTE — Progress Notes (Signed)
GYNECOLOGY PROGRESS NOTE  Subjective:    Patient ID: Cynthia Potts, female    DOB: 01-May-1941, 74 y.o.   MRN: [id number]  HPI  Patient is a 74 y.o. female who presents for intermittent vulvar itching. Notes that this has been ongoing for 8-12 months. States that approximately 1-2 days each month, she will have vulvar itching (mostly at night), that will keep her awake at night.  After several days, the itching resolves until the 3-4 weeks later. Denies any associated rashes, vaginal discharge.  Denies recent changes in soaps/detergents. Does report h/o psoriasis.  Has used OTC hydrocortisone which provides relief for ~ 2 hours during itching episode, however symptoms quickly return.  Itching mostly noted on right vulvar region.      Past Medical History  Diagnosis Date  . GERD (gastroesophageal reflux disease)   . Persistent atrial fibrillation ([place])   . Moderate tricuspid regurgitation   . Moderate mitral regurgitation   . Biatrial enlargement   . Psoriasis     Past Surgical History  Procedure Laterality Date  . None    . Tee without cardioversion N/A 05/12/2015    Procedure: TRANSESOPHAGEAL ECHOCARDIOGRAM (TEE);  Surgeon: Josue Hector, MD;  Location[facility] ENDOSCOPY;  Service: Cardiovascular;  Laterality: N/A;  . Cardiac catheterization N/A 06/06/2015    Procedure: Right Heart Cath;  Surgeon: Larey Dresser, MD;  Location: [place] CV LAB;  Service: Cardiovascular;  Laterality: N/A;  . Electrophysiologic study N/A 02/28/2015    Procedure: Cardioversion;  Surgeon: Corey Skains, MD;  Location: [facility] ORS;  Service: Cardiovascular;  Laterality: N/A;  . Electrophysiologic study N/A 03/24/2015    Procedure: Cardioversion;  Surgeon: Corey Skains, MD;  Location: [facility] ORS;  Service: Cardiovascular;  Laterality: N/A;  . Cataract extraction w/phaco Left 08/23/2015    Procedure: CATARACT EXTRACTION PHACO AND INTRAOCULAR LENS PLACEMENT ([place]);  Surgeon: Birder Robson, MD;  Location:  [facility] ORS;  Service: Ophthalmology;  Laterality: Left;  [place] 01:07 AP% 22.9 CDE 15.42 fluid pack lot [id number]    Family History  Problem Relation Age of Onset  . Stroke Father 43  . Diabetes Mother     Social History   Social History  . Marital Status: Married    Spouse Name: N/A  . Number of Children: N/A  . Years of Education: N/A   Occupational History  . Not on file.   Social History Main Topics  . Smoking status: Former Smoker -- 30 years  . Smokeless tobacco: Not on file  . Alcohol Use: 0.6 oz/week    1 Glasses of wine per week     Comment: daily  . Drug Use: No  . Sexual Activity: Not on file   Other Topics Concern  . Not on file   Social History Narrative   Lives in [place].  Retired Marine scientist.  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    Current Outpatient Prescriptions on File Prior to Visit  Medication Sig Dispense Refill  . apixaban (ELIQUIS) 5 MG TABS tablet Take 5 mg by mouth 2 (two) times daily.    . Biotin 5000 MCG TABS Take 5,000 mcg by mouth daily.    . Cholecalciferol (VITAMIN D-3) 1000 UNITS CAPS Take 1,000 Units by mouth daily.     . Coenzyme Q10 (CO Q 10) 100 MG CAPS Take 100 mg by mouth daily.     . furosemide (LASIX) 20 MG tablet Take 0.5 tablets (10 mg total) by mouth  daily. 90 tablet 3  . metoprolol succinate (TOPROL-XL) 50 MG 24 hr tablet Take 50 mg by mouth daily.     . Multiple Vitamins-Minerals (ONE-A-DAY WOMENS 50+ ADVANTAGE PO) Take 1 tablet by mouth daily.    . Omega-3 Krill Oil 500 MG CAPS Take 1,000 mg by mouth daily.     Marland Kitchen OVER THE COUNTER MEDICATION Take 2 capsules by mouth daily. "[place] Magnesium"    . OVER THE COUNTER MEDICATION Take 2 each by mouth at bedtime. "Fiber Well Gummies"    . potassium chloride (K-DUR) 10 MEQ tablet Take 1 tablet by mouth daily.    . Probiotic Product (PROBIOTIC PO) Take 1 capsule by mouth daily.    Marland Kitchen rOPINIRole (REQUIP) 1 MG tablet Take 1 mg by mouth daily.     . Sennosides 25  MG TABS Take 25 mg by mouth daily.    Marland Kitchen triamcinolone (NASACORT ALLERGY 24HR) 55 MCG/ACT AERO nasal inhaler Place 2 sprays into the nose daily as needed (for allergies).     No current facility-administered medications on file prior to visit.    No Known Allergies   Review of Systems Pertinent items noted in HPI and remainder of comprehensive ROS otherwise negative.   Objective:   Blood pressure 86/50, pulse 77, height 5\' 3"  (1.6 m), weight 137 lb 14.4 oz (62.551 kg). General appearance: alert and no distress Abdomen: soft, non-tender; bowel sounds normal; no masses,  no organomegaly Pelvic:      Vulva with small faintly discolored (whitened) patch of thickened skin at base of right vulva.       Bladder no bladder distension noted      Urethra: normal appearing urethra with no masses, tenderness or lesions     Vagina: normal appearing vagina with mild atrophy; scant normal discharge, no lesions     Cervix: normal appearing cervix without discharge or lesions     Bimanual exam not performed. Extremities: extremities normal, atraumatic, no cyanosis or edema Neurologic: Grossly normal   Assessment:   Vulvar itching  Plan:   Differential diagnosis discussed with patient, including psoriasis, lichen sclerosus, dermatitis. Vulvar punch biopsy performed today (injection of 2 cc of 1% lidocaine with epinephrine, 4 mm punch biopsy performed, figure of eight suture placed at biopsy site with 3-0 Vicryl).  To call back in 1 week for results.    Rubie Maid, MD [facility]

## 2015-10-06 ENCOUNTER — Ambulatory Visit: Payer: Medicare HMO | Admitting: Internal Medicine

## 2015-10-11 LAB — PATHOLOGY

## 2015-10-18 ENCOUNTER — Telehealth: Payer: Self-pay

## 2015-10-18 DIAGNOSIS — N904 Leukoplakia of vulva: Secondary | ICD-10-CM

## 2015-10-18 MED ORDER — CLOBETASOL PROPIONATE 0.05 % EX CREA: TOPICAL_CREAM | CUTANEOUS | Status: DC

## 2015-10-18 NOTE — Telephone Encounter (Signed)
Called pt left her message informing her of information below. RX sent in.

## 2015-10-18 NOTE — Telephone Encounter (Signed)
-----   Message from Rubie Maid, MD sent at 10/14/2015  1:56 PM EST ----- Please inform patient of pathology results (chronic inflammation with early lichen sclerosis). Can prescribe Clobetasol 0.5% cream for 12 weeks (application at night for four weeks, then every other night for four weeks, then twice weekly for four weeks)  Will need to f/u in clinic after 3-4 weeks of use.

## 2015-11-11 DIAGNOSIS — H2511 Age-related nuclear cataract, right eye: Secondary | ICD-10-CM | POA: Diagnosis not present

## 2015-11-16 ENCOUNTER — Encounter: Payer: Self-pay | Admitting: *Deleted

## 2015-11-22 ENCOUNTER — Ambulatory Visit: Payer: PPO | Admitting: Certified Registered"

## 2015-11-22 ENCOUNTER — Ambulatory Visit
Admission: RE | Admit: 2015-11-22 | Discharge: 2015-11-22 | Disposition: A | Discharge status: Home / Self Care | Payer: PPO | Source: Ambulatory Visit | Attending: Ophthalmology | Admitting: Ophthalmology

## 2015-11-22 ENCOUNTER — Encounter: Payer: Self-pay | Admitting: *Deleted

## 2015-11-22 ENCOUNTER — Encounter
Admission: RE | Disposition: A | Discharge status: Home / Self Care | Payer: Self-pay | Source: Ambulatory Visit | Attending: Ophthalmology

## 2015-11-22 DIAGNOSIS — Z87891 Personal history of nicotine dependence: Secondary | ICD-10-CM | POA: Diagnosis not present

## 2015-11-22 DIAGNOSIS — Z9842 Cataract extraction status, left eye: Secondary | ICD-10-CM | POA: Diagnosis not present

## 2015-11-22 DIAGNOSIS — L4050 Arthropathic psoriasis, unspecified: Secondary | ICD-10-CM | POA: Insufficient documentation

## 2015-11-22 DIAGNOSIS — K219 Gastro-esophageal reflux disease without esophagitis: Secondary | ICD-10-CM | POA: Diagnosis not present

## 2015-11-22 DIAGNOSIS — I509 Heart failure, unspecified: Secondary | ICD-10-CM | POA: Insufficient documentation

## 2015-11-22 DIAGNOSIS — I340 Nonrheumatic mitral (valve) insufficiency: Secondary | ICD-10-CM | POA: Insufficient documentation

## 2015-11-22 DIAGNOSIS — I481 Persistent atrial fibrillation: Secondary | ICD-10-CM | POA: Insufficient documentation

## 2015-11-22 DIAGNOSIS — H2511 Age-related nuclear cataract, right eye: Secondary | ICD-10-CM | POA: Insufficient documentation

## 2015-11-22 DIAGNOSIS — G2581 Restless legs syndrome: Secondary | ICD-10-CM | POA: Insufficient documentation

## 2015-11-22 HISTORY — DX: Restless legs syndrome: G25.81

## 2015-11-22 HISTORY — DX: Unspecified hearing loss, unspecified ear: H91.90

## 2015-11-22 HISTORY — PX: CATARACT EXTRACTION W/PHACO: SHX586

## 2015-11-22 HISTORY — DX: Arthropathic psoriasis, unspecified: L40.50

## 2015-11-22 SURGERY — PHACOEMULSIFICATION, CATARACT, WITH IOL INSERTION
Anesthesia: Monitor Anesthesia Care | Site: Eye | Laterality: Right | Wound class: Clean

## 2015-11-22 MED ORDER — CEFUROXIME OPHTHALMIC INJECTION 1 MG/0.1 ML
INJECTION | OPHTHALMIC | Status: AC
  Filled 2015-11-22: qty 0.1

## 2015-11-22 MED ORDER — MOXIFLOXACIN HCL 0.5 % OP SOLN
OPHTHALMIC | Status: AC
  Filled 2015-11-22: qty 3

## 2015-11-22 MED ORDER — NA CHONDROIT SULF-NA HYALURON 40-17 MG/ML IO SOLN
INTRAOCULAR | Status: AC
  Filled 2015-11-22: qty 1

## 2015-11-22 MED ORDER — ARMC OPHTHALMIC DILATING GEL
OPHTHALMIC | Status: AC
  Administered 2015-11-22: 1 via OPHTHALMIC
  Filled 2015-11-22: qty 0.25

## 2015-11-22 MED ORDER — TETRACAINE HCL 0.5 % OP SOLN
OPHTHALMIC | Status: AC
  Administered 2015-11-22: 1 [drp] via OPHTHALMIC
  Filled 2015-11-22: qty 2

## 2015-11-22 MED ORDER — POVIDONE-IODINE 5 % OP SOLN
1.0000 "application " | Freq: Once | OPHTHALMIC | Status: AC
  Administered 2015-11-22: 1 via OPHTHALMIC

## 2015-11-22 MED ORDER — TETRACAINE HCL 0.5 % OP SOLN
1.0000 [drp] | Freq: Once | OPHTHALMIC | Status: AC
  Administered 2015-11-22: 1 [drp] via OPHTHALMIC

## 2015-11-22 MED ORDER — MOXIFLOXACIN HCL 0.5 % OP SOLN
OPHTHALMIC | Status: DC | PRN
  Administered 2015-11-22: 1 [drp] via OPHTHALMIC

## 2015-11-22 MED ORDER — MOXIFLOXACIN HCL 0.5 % OP SOLN: 1.0000 [drp] | OPHTHALMIC | Status: DC | PRN

## 2015-11-22 MED ORDER — ARMC OPHTHALMIC DILATING GEL
1.0000 "application " | OPHTHALMIC | Status: AC
  Administered 2015-11-22: 1 via OPHTHALMIC

## 2015-11-22 MED ORDER — NA CHONDROIT SULF-NA HYALURON 40-17 MG/ML IO SOLN
INTRAOCULAR | Status: DC | PRN
  Administered 2015-11-22: 1 mL via INTRAOCULAR

## 2015-11-22 MED ORDER — CEFUROXIME OPHTHALMIC INJECTION 1 MG/0.1 ML
INJECTION | OPHTHALMIC | Status: DC | PRN
  Administered 2015-11-22: .1 mL via INTRACAMERAL

## 2015-11-22 MED ORDER — BSS IO SOLN
INTRAOCULAR | Status: DC | PRN
  Administered 2015-11-22: 1 mL via OPHTHALMIC

## 2015-11-22 MED ORDER — POVIDONE-IODINE 5 % OP SOLN
OPHTHALMIC | Status: AC
  Administered 2015-11-22: 1 via OPHTHALMIC
  Filled 2015-11-22: qty 30

## 2015-11-22 MED ORDER — SODIUM CHLORIDE 0.9 % IV SOLN
INTRAVENOUS | Status: DC
  Administered 2015-11-22: 08:00:00 via INTRAVENOUS

## 2015-11-22 MED ORDER — EPINEPHRINE HCL 1 MG/ML IJ SOLN
INTRAMUSCULAR | Status: AC
  Filled 2015-11-22: qty 1

## 2015-11-22 MED ORDER — CARBACHOL 0.01 % IO SOLN
INTRAOCULAR | Status: DC | PRN
  Administered 2015-11-22: .5 mL via INTRAOCULAR

## 2015-11-22 MED ORDER — MIDAZOLAM HCL 2 MG/2ML IJ SOLN
INTRAMUSCULAR | Status: DC | PRN
  Administered 2015-11-22: 1 mg via INTRAVENOUS

## 2015-11-22 SURGICAL SUPPLY — 21 items
CANNULA ANT/CHMB 27GA (MISCELLANEOUS) ×3 IMPLANT
CUP MEDICINE 2OZ PLAST GRAD ST (MISCELLANEOUS) ×3 IMPLANT
GLOVE BIO SURGEON STRL SZ8 (GLOVE) ×3 IMPLANT
GLOVE BIOGEL M 6.5 STRL (GLOVE) ×3 IMPLANT
GLOVE SURG LX 8.0 MICRO (GLOVE) ×2
GLOVE SURG LX STRL 8.0 MICRO (GLOVE) ×1 IMPLANT
GOWN STRL REUS W/ TWL LRG LVL3 (GOWN DISPOSABLE) ×2 IMPLANT
GOWN STRL REUS W/TWL LRG LVL3 (GOWN DISPOSABLE) ×4
LENS IOL TECNIS SYMFONY 22.0 (Intraocular Lens) ×3 IMPLANT
PACK CATARACT (MISCELLANEOUS) ×3 IMPLANT
PACK CATARACT BRASINGTON LX (MISCELLANEOUS) ×3 IMPLANT
PACK EYE AFTER SURG (MISCELLANEOUS) ×3 IMPLANT
SOL BSS BAG (MISCELLANEOUS) ×3
SOL PREP PVP 2OZ (MISCELLANEOUS) ×3
SOLUTION BSS BAG (MISCELLANEOUS) ×1 IMPLANT
SOLUTION PREP PVP 2OZ (MISCELLANEOUS) ×1 IMPLANT
SYR 3ML LL SCALE MARK (SYRINGE) ×3 IMPLANT
SYR 5ML LL (SYRINGE) ×3 IMPLANT
SYR TB 1ML 27GX1/2 LL (SYRINGE) ×3 IMPLANT
WATER STERILE IRR 1000ML POUR (IV SOLUTION) ×3 IMPLANT
WIPE NON LINTING 3.25X3.25 (MISCELLANEOUS) ×3 IMPLANT

## 2015-11-22 NOTE — Transfer of Care (Signed)
Immediate Anesthesia Transfer of Care Note  Patient: Cynthia Potts  Procedure(s) Performed: Procedure(s) with comments: CATARACT EXTRACTION PHACO AND INTRAOCULAR LENS PLACEMENT (IOC) (Right) - [place]  0:58.2 AP%  15.2 CDE   8.98 fluid pack lot # [id number]  Patient Location: Short Stay  Anesthesia Type:MAC  Level of Consciousness: awake  Airway & Oxygen Therapy: Patient Spontanous Breathing  Post-op Assessment: Report given to RN  Post vital signs: Reviewed  Last Vitals:  Filed Vitals:   11/22/15 0817 11/22/15 0947  BP: 99/47 117/77  Pulse: 72   Temp: 36.1 C 36.6 C  Resp: 14 16    Complications: No apparent anesthesia complications

## 2015-11-22 NOTE — Op Note (Signed)
PREOPERATIVE DIAGNOSIS:  Nuclear sclerotic cataract of the right eye.   POSTOPERATIVE DIAGNOSIS: nuclear sclerotic cataract right eye   OPERATIVE PROCEDURE:  Procedure(s): CATARACT EXTRACTION PHACO AND INTRAOCULAR LENS PLACEMENT (IOC)   SURGEON:  Birder Robson, MD.   ANESTHESIA:  Anesthesiologist: Molli Barrows, MD CRNA: Rolla Plate, CRNA  1.      Managed anesthesia care. 2.      Topical tetracaine drops followed by 2% Xylocaine jelly applied in the preoperative holding area.   COMPLICATIONS:  None.   TECHNIQUE:   Stop and chop   DESCRIPTION OF PROCEDURE:  The patient was examined and consented in the preoperative holding area where the aforementioned topical anesthesia was applied to the right eye and then brought back to the Operating Room where the right eye was prepped and draped in the usual sterile ophthalmic fashion and a lid speculum was placed. A paracentesis was created with the side port blade and the anterior chamber was filled with viscoelastic. A near clear corneal incision was performed with the steel keratome. A continuous curvilinear capsulorrhexis was performed with a cystotome followed by the capsulorrhexis forceps. Hydrodissection and hydrodelineation were carried out with BSS on a blunt cannula. The lens was removed in a stop and chop  technique and the remaining cortical material was removed with the irrigation-aspiration handpiece. The capsular bag was inflated with viscoelastic and the Technis [id number]  lens was placed in the capsular bag without complication. The remaining viscoelastic was removed from the eye with the irrigation-aspiration handpiece. The wounds were hydrated. The anterior chamber was flushed with Miostat and the eye was inflated to physiologic pressure. 0.1 mL of cefuroxime concentration 10 mg/mL was placed in the anterior chamber. The wounds were found to be water tight. The eye was dressed with Vigamox. The patient was given protective glasses to  wear throughout the day and a shield with which to sleep tonight. The patient was also given drops with which to begin a drop regimen today and will follow-up with me in one day.  Implant Name Type Inv. Item Serial No. Manufacturer Lot No. LRB No. Used  tecnis symphony      [id number] [employer]   Right 1   Procedure(s) with comments: CATARACT EXTRACTION PHACO AND INTRAOCULAR LENS PLACEMENT (IOC) (Right) - [place]  0:58.2 AP%  15.2 CDE   8.98 fluid pack lot # [id number]  Electronically signed: [place] 11/22/2015 9:45 AM

## 2015-11-22 NOTE — Anesthesia Preprocedure Evaluation (Addendum)
Anesthesia Evaluation  Patient identified by MRN, date of birth, ID band Patient awake    Reviewed: Allergy & Precautions, H&P , NPO status , Patient's Chart, lab work & pertinent test results, reviewed documented beta blocker date and time   Airway Mallampati: II   Neck ROM: full    Dental  (+) Poor Dentition, Implants   Pulmonary neg pulmonary ROS, former smoker,    Pulmonary exam normal        Cardiovascular +CHF  negative cardio ROS Normal cardiovascular exam     Neuro/Psych negative neurological ROS  negative psych ROS   GI/Hepatic negative GI ROS, Neg liver ROS, GERD  ,  Endo/Other  negative endocrine ROS  Renal/GU negative Renal ROS  negative genitourinary   Musculoskeletal   Abdominal   Peds  Hematology negative hematology ROS (+)   Anesthesia Other Findings Past Medical History:   GERD (gastroesophageal reflux disease)                       Persistent atrial fibrillation (H[facility])                         Moderate tricuspid regurgitation                             Moderate mitral regurgitation                                Biatrial enlargement                                         Psoriasis                                                    Psoriatic arthritis (H[facility])                                    HOH (hard of hearing)                                        RLS (restless legs syndrome)                               Past Surgical History:   none                                                          TEE WITHOUT CARDIOVERSION                       N/A 05/12/2015       Comment:Procedure: TRANSESOPHAGEAL ECHOCARDIOGRAM               (TEE);  Surgeon: Josue Hector,  MD;  Location:              [place];  Service: Cardiovascular;                Laterality: N/A;   CARDIAC CATHETERIZATION                         N/A 06/06/2015      Comment:Procedure: Right Heart Cath;  Surgeon: Larey Dresser, MD;  Location: [place] CV LAB;                Service: Cardiovascular;  Laterality: N/A;   ELECTROPHYSIOLOGIC STUDY                        N/A 02/28/2015      Comment:Procedure: Cardioversion;  Surgeon: Corey Skains, MD;  Location: [facility] ORS;  Service:               Cardiovascular;  Laterality: N/A;   ELECTROPHYSIOLOGIC STUDY                        N/A 03/24/2015      Comment:Procedure: Cardioversion;  Surgeon: Corey Skains, MD;  Location: [facility] ORS;  Service:               Cardiovascular;  Laterality: N/A;   CATARACT EXTRACTION W/PHACO                     Left 08/23/2015     Comment:Procedure: CATARACT EXTRACTION PHACO AND               INTRAOCULAR LENS PLACEMENT ([place]);  Surgeon:               Birder Robson, MD;  Location: [facility] ORS;                Service: Ophthalmology;  Laterality: Left;  [place]               01:07 AP% 22.9 CDE 15.42 fluid pack lot               [id number]   TUBAL LIGATION                                              BMI    Body Mass Index   23.56 kg/m 2     Reproductive/Obstetrics negative OB ROS                            Anesthesia Physical Anesthesia Plan  ASA: III  Anesthesia Plan: MAC   Post-op Pain Management:    Induction:   Airway Management Planned: Natural Airway  Additional Equipment:   Intra-op Plan:   Post-operative Plan:   Informed Consent: I have reviewed the patients History and Physical, chart, labs and discussed the procedure including the risks, benefits and alternatives for the proposed anesthesia with the patient or authorized representative who has indicated his/her understanding and acceptance.   Dental Advisory Given  Plan Discussed with: CRNA  Anesthesia Plan Comments:  Anesthesia Quick Evaluation  

## 2015-11-22 NOTE — H&P (Signed)
All labs reviewed. Abnormal studies sent to patients PCP when indicated.  Previous H&P reviewed, patient examined, there are NO CHANGES.  Dawn Kiper LOUIS2/14/20179:07 AM

## 2015-11-22 NOTE — Anesthesia Postprocedure Evaluation (Signed)
Anesthesia Post Note  Patient: Cynthia Potts  Procedure(s) Performed: Procedure(s) (LRB): CATARACT EXTRACTION PHACO AND INTRAOCULAR LENS PLACEMENT (IOC) (Right)  Patient location during evaluation: Short Stay Anesthesia Type: MAC Level of consciousness: awake and alert Pain management: pain level controlled Vital Signs Assessment: post-procedure vital signs reviewed and stable Respiratory status: spontaneous breathing Cardiovascular status: blood pressure returned to baseline Postop Assessment: no headache Anesthetic complications: no    Last Vitals:  Filed Vitals:   11/22/15 0817 11/22/15 0947  BP: 99/47 117/77  Pulse: 72   Temp: 36.1 C 36.6 C  Resp: 14 16    Last Pain: There were no vitals filed for this visit.               Rolla Plate P

## 2015-11-23 ENCOUNTER — Encounter: Payer: Self-pay | Admitting: Ophthalmology

## 2016-01-06 ENCOUNTER — Other Ambulatory Visit: Payer: Self-pay | Admitting: Cardiology

## 2016-01-20 ENCOUNTER — Encounter: Payer: Self-pay | Admitting: Internal Medicine

## 2016-01-26 DIAGNOSIS — I481 Persistent atrial fibrillation: Secondary | ICD-10-CM | POA: Diagnosis not present

## 2016-01-26 DIAGNOSIS — R002 Palpitations: Secondary | ICD-10-CM | POA: Diagnosis not present

## 2016-01-26 DIAGNOSIS — I517 Cardiomegaly: Secondary | ICD-10-CM | POA: Diagnosis not present

## 2016-01-26 DIAGNOSIS — I340 Nonrheumatic mitral (valve) insufficiency: Secondary | ICD-10-CM | POA: Diagnosis not present

## 2016-07-09 ENCOUNTER — Other Ambulatory Visit: Payer: Self-pay | Admitting: Internal Medicine

## 2016-07-09 DIAGNOSIS — Z1231 Encounter for screening mammogram for malignant neoplasm of breast: Secondary | ICD-10-CM

## 2016-07-31 ENCOUNTER — Ambulatory Visit: Payer: PPO | Attending: Internal Medicine

## 2016-08-07 ENCOUNTER — Telehealth: Payer: Self-pay | Admitting: Obstetrics and Gynecology

## 2016-08-07 NOTE — Telephone Encounter (Signed)
PLEASE DISREGARD MESSAGE FROM BELOW, CHERRY HAD A CANCELLATION FOR TOMORROW CALLED [employer] AND SCHEDULED HER APPT IN THE CX SLOT, SO THAT THE WORK IN SLOT IS OPENED.

## 2016-08-07 NOTE — Telephone Encounter (Signed)
PT CALLED AND SHE IS HAVING IRRITATION WITH VAGINAL DISCHARGE AND SHE IS LEAVING TO GO OUT OF TOWN Thursday AND WANTED TO NOW IF SHE CAN BE SEEN BEFORE SHE LEAVE SINCE IT IS VERY UNCOMFORTABLE, I WENT AHEAD AND PUT HER IN THE WORK IN SLOT FOR Wednesday AT 4:30 JSUT TO HOLD IT BUT I TOLD HER WE WOULD CALL HER TO SEE WHEN SHE CAN BE SEEN, LET ME KNOW.

## 2016-08-08 ENCOUNTER — Ambulatory Visit (INDEPENDENT_AMBULATORY_CARE_PROVIDER_SITE_OTHER): Payer: Medicare HMO | Admitting: Obstetrics and Gynecology

## 2016-08-08 ENCOUNTER — Encounter: Payer: Medicare HMO | Admitting: Obstetrics and Gynecology

## 2016-08-08 VITALS — BP 109/69 | HR 54 | Ht 63.0 in | Wt 139.0 lb

## 2016-08-08 DIAGNOSIS — N952 Postmenopausal atrophic vaginitis: Secondary | ICD-10-CM

## 2016-08-08 DIAGNOSIS — B373 Candidiasis of vulva and vagina: Secondary | ICD-10-CM

## 2016-08-08 DIAGNOSIS — B3731 Acute candidiasis of vulva and vagina: Secondary | ICD-10-CM

## 2016-08-08 MED ORDER — FLUCONAZOLE 150 MG PO TABS: 150.0000 mg | ORAL_TABLET | Freq: Once | ORAL | 2 refills | Status: AC

## 2016-08-08 NOTE — Patient Instructions (Signed)

## 2016-08-08 NOTE — Progress Notes (Signed)
    GYNECOLOGY PROGRESS NOTE  Subjective:    Patient ID: Cynthia Potts, female    DOB: 13-Jan-1941, 75 y.o.   MRN: [id number]  HPI  Patient is a 75 y.o. female with h/o lichen sclerosis who presents for complaint so f vaginal discharge x 2-3 days.  Notes that it has lessened since yesterday.  Was initially noting associated vaginal irritation. Denies vaginal odor. Discharge is yellowish in color. States that she is leaving town tomorrow and wanted to be checked out before she left. Patient is not sexually active.   The following portions of the patient's history were reviewed and updated as appropriate: allergies, current medications, past family history, past medical history, past social history, past surgical history and problem list.  Review of Systems Pertinent items noted in HPI and remainder of comprehensive ROS otherwise negative.   Objective:   Blood pressure 109/69, pulse (!) 54, height 5\' 3"  (1.6 m), weight 139 lb (63 kg). General appearance: alert and no distress Abdomen: soft, non-tender; bowel sounds normal; no masses,  no organomegaly Pelvic: external genitalia normal.  Vaginal atrophy present.  Scant thin yellow discharge at cervical os. No cervical lesions, or CMT.    Microscopic wet-mount exam shows hyphae present.  No trichomonads or clue cells. Normal epithelial cells.  KOH done.   Assessment:   Yeast vaginitis Vaginal atrophy  Plan:   Prescribed Diflucan tablet for treatment of yeast infection. Can take a 2nd tablet in 3 days if no relief from symptoms.  Paitent with vaginal atrophy, denies symptoms.  No further management needed at this time.  To f/u if symptoms worsen or do not improve.    Rubie Maid, MD [facility]

## 2016-08-23 ENCOUNTER — Other Ambulatory Visit (HOSPITAL_COMMUNITY): Payer: Self-pay | Admitting: *Deleted

## 2016-08-23 MED ORDER — POTASSIUM CHLORIDE ER 10 MEQ PO TBCR: 10.0000 meq | EXTENDED_RELEASE_TABLET | Freq: Every day | ORAL | 6 refills | Status: DC

## 2016-11-07 ENCOUNTER — Other Ambulatory Visit: Payer: Self-pay | Admitting: Specialist

## 2016-11-07 DIAGNOSIS — M25562 Pain in left knee: Secondary | ICD-10-CM

## 2016-11-21 ENCOUNTER — Ambulatory Visit
Admission: RE | Admit: 2016-11-21 | Discharge: 2016-11-21 | Disposition: A | Discharge status: Home / Self Care | Payer: Medicare HMO | Source: Ambulatory Visit | Attending: Specialist | Admitting: Specialist

## 2016-11-21 DIAGNOSIS — S83242A Other tear of medial meniscus, current injury, left knee, initial encounter: Secondary | ICD-10-CM | POA: Diagnosis not present

## 2016-11-21 DIAGNOSIS — M25562 Pain in left knee: Secondary | ICD-10-CM | POA: Diagnosis present

## 2016-11-21 DIAGNOSIS — X58XXXA Exposure to other specified factors, initial encounter: Secondary | ICD-10-CM | POA: Diagnosis not present

## 2016-11-21 DIAGNOSIS — M7122 Synovial cyst of popliteal space [Baker], left knee: Secondary | ICD-10-CM | POA: Insufficient documentation

## 2016-11-21 DIAGNOSIS — M25462 Effusion, left knee: Secondary | ICD-10-CM | POA: Diagnosis not present

## 2017-04-01 ENCOUNTER — Other Ambulatory Visit: Payer: Self-pay | Admitting: Cardiology

## 2017-04-22 ENCOUNTER — Encounter
Admission: RE | Admit: 2017-04-22 | Discharge: 2017-04-22 | Disposition: A | Discharge status: Home / Self Care | Payer: Medicare HMO | Source: Ambulatory Visit | Attending: Orthopedic Surgery | Admitting: Orthopedic Surgery

## 2017-04-22 DIAGNOSIS — M2392 Unspecified internal derangement of left knee: Secondary | ICD-10-CM | POA: Diagnosis not present

## 2017-04-22 DIAGNOSIS — Z01812 Encounter for preprocedural laboratory examination: Secondary | ICD-10-CM | POA: Insufficient documentation

## 2017-04-22 HISTORY — DX: Unspecified osteoarthritis, unspecified site: M19.90

## 2017-04-22 LAB — POTASSIUM: POTASSIUM: 4.3 mmol/L (ref 3.5–5.1)

## 2017-04-22 NOTE — Pre-Procedure Instructions (Signed)
Component Name Value Ref Range  Vent Rate (bpm) 50   PR Interval (msec) 152   QRS Interval (msec) 72   QT Interval (msec) 474   QTc (msec) 432   Result Narrative  Sinus bradycardia with marked sinus arrhythmia Possible Left atrial enlargement Borderline ECG When compared with ECG of 17-Sep-2016 11:16, aberrant conduction is no longer present I reviewed and concur with this report. Electronically signed [id number] MD, Darnell Level [phone]) on 01/24/2017 12:25:10 PM  Status

## 2017-04-22 NOTE — Patient Instructions (Signed)
  Your procedure is scheduled on: May 08, 2017 [facility]) Report to Same Day Surgery 2nd floor medical mall [facility] Entrance-take elevator on left to 2nd floor.  Check in with surgery information desk.) To find out your arrival time please call [phone] between 1PM - 3PM on May 07, 2017 [facility])  Remember: Instructions that are not followed completely may result in serious medical risk, up to and including death, or upon the discretion of your surgeon and anesthesiologist your surgery may need to be rescheduled.    _x___ 1. Do not eat food or drink liquids after midnight. No gum chewing or hard candies                               __x__ 2. No Alcohol for 24 hours before or after surgery.   __x__3. No Smoking for 24 prior to surgery.   ____  4. Bring all medications with you on the day of surgery if instructed.    __x__ 5. Notify your doctor if there is any change in your medical condition     (cold, fever, infections).     Do not wear jewelry, make-up, hairpins, clips or nail polish.  Do not wear lotions, powders, or perfumes.     Do not shave 48 hours prior to surgery. Men may shave face and neck.  Do not bring valuables to the hospital.    [facility] is not responsible for any belongings or valuables.               Contacts, dentures or bridgework may not be worn into surgery.  Leave your suitcase in the car. After surgery it may be brought to your room.  For patients admitted to the hospital, discharge time is determined by your  treatment team                      Patients discharged the day of surgery will not be allowed to drive home.  You will need someone to drive you home and stay with you the night of your procedure.    Please read over the following fact sheets that you were given:   [facility] Preparing for Surgery and or MRSA Information   _x___ Take the following medication with a sip of water the morning of surgery :  1. MAGNESIUM  OXIDE  2.  3.  4.  5.  6.  ____Fleets enema or Magnesium Citrate as directed.   _x___ Use CHG Soap or sage wipes as directed on instruction sheet   ____ Use inhalers on the day of surgery and bring to hospital day of surgery  ____ Stop Metformin and Janumet 2 days prior to surgery.    ____ Take 1/2 of usual insulin dose the night before surgery and none on the morning surgery     _x___ Follow recommendations from Cardiologist, Pulmonologist or PCP regarding          stopping Aspirin, Coumadin, Plavix ,Eliquis, Effient, or Pradaxa, and Pletal. (CONTACT DR Nehemiah Massed ABOUT STOPPING ELIQUIS PRIOR TO SURGERY )  X____Stop Anti-inflammatories such as Advil, Aleve, Ibuprofen, Motrin, Naproxen, Naprosyn, Goodies powders or aspirin products. OK to take Tylenol    _x___ Stop supplements until after surgery.  But may continue Vitamin D, Vitamin B,  and multivitamin  (STOP COQ-10, KRILL OIL, PROBIOTIC AND COLLAGEN )   ____ Bring C-Pap to the hospital.

## 2017-04-29 ENCOUNTER — Other Ambulatory Visit: Payer: Medicare HMO

## 2017-05-08 ENCOUNTER — Ambulatory Visit: Payer: Medicare HMO | Admitting: Certified Registered Nurse Anesthetist

## 2017-05-08 ENCOUNTER — Ambulatory Visit
Admission: RE | Admit: 2017-05-08 | Discharge: 2017-05-08 | Disposition: A | Discharge status: Home / Self Care | Payer: Medicare HMO | Source: Ambulatory Visit | Attending: Orthopedic Surgery | Admitting: Orthopedic Surgery

## 2017-05-08 ENCOUNTER — Encounter: Payer: Self-pay | Admitting: Orthopedic Surgery

## 2017-05-08 ENCOUNTER — Encounter
Admission: RE | Disposition: A | Discharge status: Home / Self Care | Payer: Self-pay | Source: Ambulatory Visit | Attending: Orthopedic Surgery

## 2017-05-08 DIAGNOSIS — K219 Gastro-esophageal reflux disease without esophagitis: Secondary | ICD-10-CM | POA: Insufficient documentation

## 2017-05-08 DIAGNOSIS — Z87891 Personal history of nicotine dependence: Secondary | ICD-10-CM | POA: Diagnosis not present

## 2017-05-08 DIAGNOSIS — M2392 Unspecified internal derangement of left knee: Secondary | ICD-10-CM | POA: Diagnosis not present

## 2017-05-08 DIAGNOSIS — I4891 Unspecified atrial fibrillation: Secondary | ICD-10-CM | POA: Diagnosis not present

## 2017-05-08 DIAGNOSIS — Z7901 Long term (current) use of anticoagulants: Secondary | ICD-10-CM | POA: Diagnosis not present

## 2017-05-08 HISTORY — PX: KNEE ARTHROSCOPY: SHX127

## 2017-05-08 SURGERY — ARTHROSCOPY, KNEE
Anesthesia: General | Laterality: Left | Wound class: Clean

## 2017-05-08 MED ORDER — FENTANYL CITRATE (PF) 100 MCG/2ML IJ SOLN
25.0000 ug | INTRAMUSCULAR | Status: DC | PRN
  Administered 2017-05-08 (×4): 25 ug via INTRAVENOUS

## 2017-05-08 MED ORDER — ONDANSETRON HCL 4 MG/2ML IJ SOLN: 4.0000 mg | Freq: Once | INTRAMUSCULAR | Status: DC | PRN

## 2017-05-08 MED ORDER — FENTANYL CITRATE (PF) 100 MCG/2ML IJ SOLN
INTRAMUSCULAR | Status: AC
  Filled 2017-05-08: qty 2

## 2017-05-08 MED ORDER — ONDANSETRON HCL 4 MG PO TABS: 4.0000 mg | ORAL_TABLET | Freq: Four times a day (QID) | ORAL | Status: DC | PRN

## 2017-05-08 MED ORDER — PROPOFOL 10 MG/ML IV BOLUS
INTRAVENOUS | Status: AC
  Filled 2017-05-08: qty 20

## 2017-05-08 MED ORDER — ACETAMINOPHEN 10 MG/ML IV SOLN
INTRAVENOUS | Status: AC
  Filled 2017-05-08: qty 100

## 2017-05-08 MED ORDER — SEVOFLURANE IN SOLN
RESPIRATORY_TRACT | Status: AC
  Filled 2017-05-08: qty 250

## 2017-05-08 MED ORDER — ONDANSETRON HCL 4 MG/2ML IJ SOLN: 4.0000 mg | Freq: Four times a day (QID) | INTRAMUSCULAR | Status: DC | PRN

## 2017-05-08 MED ORDER — DEXAMETHASONE SODIUM PHOSPHATE 10 MG/ML IJ SOLN
INTRAMUSCULAR | Status: AC
  Filled 2017-05-08: qty 1

## 2017-05-08 MED ORDER — FAMOTIDINE 20 MG PO TABS
ORAL_TABLET | ORAL | Status: AC
  Filled 2017-05-08: qty 1

## 2017-05-08 MED ORDER — BUPIVACAINE HCL (PF) 0.25 % IJ SOLN
INTRAMUSCULAR | Status: AC
  Filled 2017-05-08: qty 30

## 2017-05-08 MED ORDER — FAMOTIDINE 20 MG PO TABS
20.0000 mg | ORAL_TABLET | Freq: Once | ORAL | Status: AC
  Administered 2017-05-08: 20 mg via ORAL

## 2017-05-08 MED ORDER — MORPHINE SULFATE (PF) 4 MG/ML IV SOLN
INTRAVENOUS | Status: AC
  Filled 2017-05-08: qty 1

## 2017-05-08 MED ORDER — FENTANYL CITRATE (PF) 100 MCG/2ML IJ SOLN
INTRAMUSCULAR | Status: AC
  Administered 2017-05-08: 25 ug via INTRAVENOUS
  Filled 2017-05-08: qty 2

## 2017-05-08 MED ORDER — HYDROCODONE-ACETAMINOPHEN 5-325 MG PO TABS
ORAL_TABLET | ORAL | Status: AC
  Filled 2017-05-08: qty 1

## 2017-05-08 MED ORDER — ONDANSETRON HCL 4 MG/2ML IJ SOLN
INTRAMUSCULAR | Status: DC | PRN
Start: 2017-05-08 — End: 2017-05-08
  Administered 2017-05-08: 4 mg via INTRAVENOUS

## 2017-05-08 MED ORDER — HYDROCODONE-ACETAMINOPHEN 5-325 MG PO TABS: 1.0000 | ORAL_TABLET | ORAL | 0 refills | Status: DC | PRN

## 2017-05-08 MED ORDER — METOCLOPRAMIDE HCL 10 MG PO TABS: 5.0000 mg | ORAL_TABLET | Freq: Three times a day (TID) | ORAL | Status: DC | PRN

## 2017-05-08 MED ORDER — DEXAMETHASONE SODIUM PHOSPHATE 10 MG/ML IJ SOLN
INTRAMUSCULAR | Status: DC | PRN
  Administered 2017-05-08: 5 mg via INTRAVENOUS

## 2017-05-08 MED ORDER — SODIUM CHLORIDE 0.9 % IV SOLN: INTRAVENOUS | Status: DC

## 2017-05-08 MED ORDER — FENTANYL CITRATE (PF) 100 MCG/2ML IJ SOLN
INTRAMUSCULAR | Status: DC | PRN
  Administered 2017-05-08: 25 ug via INTRAVENOUS
  Administered 2017-05-08: 50 ug via INTRAVENOUS
  Administered 2017-05-08 (×2): 25 ug via INTRAVENOUS
  Administered 2017-05-08: 50 ug via INTRAVENOUS
  Administered 2017-05-08: 25 ug via INTRAVENOUS

## 2017-05-08 MED ORDER — METOPROLOL TARTRATE 5 MG/5ML IV SOLN
INTRAVENOUS | Status: AC
  Administered 2017-05-08: 2 mg via INTRAVENOUS
  Filled 2017-05-08: qty 5

## 2017-05-08 MED ORDER — METOCLOPRAMIDE HCL 5 MG/ML IJ SOLN: 5.0000 mg | Freq: Three times a day (TID) | INTRAMUSCULAR | Status: DC | PRN

## 2017-05-08 MED ORDER — CHLORHEXIDINE GLUCONATE 4 % EX LIQD: 60.0000 mL | Freq: Once | CUTANEOUS | Status: DC

## 2017-05-08 MED ORDER — HYDROCODONE-ACETAMINOPHEN 5-325 MG PO TABS
1.0000 | ORAL_TABLET | ORAL | Status: DC | PRN
  Administered 2017-05-08: 1 via ORAL

## 2017-05-08 MED ORDER — BUPIVACAINE-EPINEPHRINE 0.25% -1:200000 IJ SOLN
INTRAMUSCULAR | Status: DC | PRN
  Administered 2017-05-08: 5 mL

## 2017-05-08 MED ORDER — LACTATED RINGERS IV SOLN
INTRAVENOUS | Status: DC
  Administered 2017-05-08 (×2): via INTRAVENOUS

## 2017-05-08 MED ORDER — PROPOFOL 10 MG/ML IV BOLUS
INTRAVENOUS | Status: DC | PRN
  Administered 2017-05-08: 120 mg via INTRAVENOUS

## 2017-05-08 MED ORDER — ONDANSETRON HCL 4 MG/2ML IJ SOLN
INTRAMUSCULAR | Status: AC
  Filled 2017-05-08: qty 2

## 2017-05-08 MED ORDER — EPINEPHRINE PF 1 MG/ML IJ SOLN
INTRAMUSCULAR | Status: AC
  Filled 2017-05-08: qty 1

## 2017-05-08 MED ORDER — METOPROLOL TARTRATE 5 MG/5ML IV SOLN
2.0000 mg | INTRAVENOUS | Status: DC | PRN
  Administered 2017-05-08 (×2): 2 mg via INTRAVENOUS

## 2017-05-08 MED ORDER — ACETAMINOPHEN 10 MG/ML IV SOLN
INTRAVENOUS | Status: DC | PRN
  Administered 2017-05-08: 1000 mg via INTRAVENOUS

## 2017-05-08 SURGICAL SUPPLY — 23 items
BLADE SHAVER 4.5 DBL SERAT CV (CUTTER) IMPLANT
BNDG ESMARK 6X12 TAN STRL LF (GAUZE/BANDAGES/DRESSINGS) ×3 IMPLANT
CUFF TOURN 24 STER (MISCELLANEOUS) ×3 IMPLANT
CUFF TOURN 30 STER DUAL PORT (MISCELLANEOUS) IMPLANT
DRSG DERMACEA 8X12 NADH (GAUZE/BANDAGES/DRESSINGS) ×3 IMPLANT
DURAPREP 26ML APPLICATOR (WOUND CARE) ×3 IMPLANT
GAUZE SPONGE 4X4 12PLY STRL (GAUZE/BANDAGES/DRESSINGS) ×3 IMPLANT
GLOVE BIOGEL M STRL SZ7.5 (GLOVE) ×6 IMPLANT
GLOVE INDICATOR 8.0 STRL GRN (GLOVE) ×3 IMPLANT
GOWN STRL REUS W/ TWL LRG LVL3 (GOWN DISPOSABLE) ×2 IMPLANT
GOWN STRL REUS W/TWL LRG LVL3 (GOWN DISPOSABLE) ×4
IV LACTATED RINGER IRRG 3000ML (IV SOLUTION) ×12
IV LR IRRIG 3000ML ARTHROMATIC (IV SOLUTION) ×6 IMPLANT
KIT RM TURNOVER STRD PROC AR (KITS) ×3 IMPLANT
MANIFOLD NEPTUNE II (INSTRUMENTS) ×3 IMPLANT
PACK ARTHROSCOPY KNEE (MISCELLANEOUS) ×3 IMPLANT
SET TUBE SUCT SHAVER OUTFL 24K (TUBING) ×3 IMPLANT
SET TUBE TIP INTRA-ARTICULAR (MISCELLANEOUS) ×3 IMPLANT
SUT ETHILON 3-0 FS-10 30 BLK (SUTURE) ×3
SUTURE EHLN 3-0 FS-10 30 BLK (SUTURE) ×1 IMPLANT
TUBING ARTHRO INFLOW-ONLY STRL (TUBING) ×3 IMPLANT
WAND COBLATION FLOW 50 (SURGICAL WAND) ×3 IMPLANT
WRAP KNEE W/COLD PACKS 25.5X14 (SOFTGOODS) ×3 IMPLANT

## 2017-05-08 NOTE — Anesthesia Preprocedure Evaluation (Signed)
Anesthesia Evaluation  Patient identified by MRN, date of birth, ID band Patient awake    Reviewed: Allergy & Precautions, H&P , NPO status , Patient's Chart, lab work & pertinent test results, reviewed documented beta blocker date and time   Airway Mallampati: II  TM Distance: >3 FB Neck ROM: full    Dental  (+) Teeth Intact   Pulmonary neg pulmonary ROS, former smoker,    Pulmonary exam normal        Cardiovascular Exercise Tolerance: Good +CHF  negative cardio ROS Normal cardiovascular examAtrial Fibrillation  Rate:Normal     Neuro/Psych negative neurological ROS  negative psych ROS   GI/Hepatic negative GI ROS, Neg liver ROS, GERD  Medicated,  Endo/Other  negative endocrine ROS  Renal/GU negative Renal ROS  negative genitourinary   Musculoskeletal   Abdominal   Peds  Hematology negative hematology ROS (+)   Anesthesia Other Findings   Reproductive/Obstetrics negative OB ROS                             Anesthesia Physical Anesthesia Plan  ASA: III  Anesthesia Plan: General LMA   Post-op Pain Management:    Induction:   PONV Risk Score and Plan:   Airway Management Planned:   Additional Equipment:   Intra-op Plan:   Post-operative Plan:   Informed Consent: I have reviewed the patients History and Physical, chart, labs and discussed the procedure including the risks, benefits and alternatives for the proposed anesthesia with the patient or authorized representative who has indicated his/her understanding and acceptance.     Plan Discussed with: CRNA  Anesthesia Plan Comments:         Anesthesia Quick Evaluation

## 2017-05-08 NOTE — Op Note (Signed)
OPERATIVE NOTE  DATE OF SURGERY:  05/08/2017  PATIENT NAME:  Cynthia Potts   DOB: 13-Nov-1940  MRN: [id number]   PRE-OPERATIVE DIAGNOSIS:  Internal derangement of the left knee   POST-OPERATIVE DIAGNOSIS:   Tear of the posterior horn of the medial meniscus, left knee  PROCEDURE:  Left knee arthroscopy, partial medial meniscectomy  SURGEON:  Marciano Sequin., M.D.   ASSISTANT: none  ANESTHESIA: general  ESTIMATED BLOOD LOSS: Minimal  FLUIDS REPLACED: 1000 mL of crystalloid  TOURNIQUET TIME: Not used   DRAINS: none  IMPLANTS UTILIZED: None  INDICATIONS FOR SURGERY: Cynthia Potts is a 76 y.o. year old female who has been seen for complaints of left knee pain. MRI demonstrated findings consistent with meniscal pathology. After discussion of the risks and benefits of surgical intervention, the patient expressed understanding of the risks benefits and agree with plans for left knee arthroscopy.   PROCEDURE IN DETAIL: The patient was brought into the operating room and, after adequate general anesthesia was achieved, a tourniquet was applied to the left thigh and the leg was placed in the leg holder. All bony prominences were well padded. The patient's left knee was cleaned and prepped with alcohol and Duraprep and draped in the usual sterile fashion. A "timeout" was performed as per usual protocol. The anticipated portal sites were injected with 0.25% Marcaine with epinephrine. An anterolateral incision was made and a cannula was inserted. A moderate effusion was evacuated and the knee was distended with fluid using the pump. The scope was advanced down the medial gutter into the medial compartment. Under visualization with the scope, an anteromedial portal was created and a hooked probe was inserted. The medial meniscus was visualized and probed. There was a complex tear involving the posterior horn of the medial meniscus. The tear was debrided using meniscal punches and the ArthroCare  wand. Contouring was performed using ArthroCare wand. The articular cartilage was visualized and was noted be in good condition.  The scope was then advanced into the intercondylar notch. The anterior cruciate ligament was visualized and probed and felt to be intact. The scope was removed from the lateral portal and reinserted via the anteromedial portal to better visualize the lateral compartment. The lateral meniscus was visualized and probed. The lateral meniscus was intact without evidence of tear or instability. The articular cartilage of the lateral compartment was visualized and noted to be in good condition. Finally, the scope was advanced so as to visualize the patellofemoral articulation. Good patellar tracking was appreciated. The articular surface was in excellent condition. A thickened plica was debrided using the ArthroCare wand.  The knee was irrigated with copius amounts of fluid and suctioned dry. The anterolateral portal was re-approximated with #3-0 nylon. A combination of 0.25% Marcaine with epinephrine and 4 mg of Morphine were injected via the scope. The scope was removed and the anteromedial portal was re-approximated with #3-0 nylon. A sterile dressing was applied followed by application of an ice wrap.  The patient tolerated the procedure well and was transported to the PACU in stable condition.  James P. Holley Bouche., M.D.

## 2017-05-08 NOTE — Discharge Instructions (Signed)
AMBULATORY SURGERY  °DISCHARGE INSTRUCTIONS ° ° °1) The drugs that you were given will stay in your system until tomorrow so for the next 24 hours you should not: ° °A) Drive an automobile °B) Make any legal decisions °C) Drink any alcoholic beverage ° ° °2) You may resume regular meals tomorrow.  Today it is better to start with liquids and gradually work up to solid foods. ° °You may eat anything you prefer, but it is better to start with liquids, then soup and crackers, and gradually work up to solid foods. ° ° °3) Please notify your doctor immediately if you have any unusual bleeding, trouble breathing, redness and pain at the surgery site, drainage, fever, or pain not relieved by medication. °4)  ° °5) Your post-operative visit with Dr.                     °           °     is: Date:                        Time:   ° °Please call to schedule your post-operative visit. ° °6) Additional Instructions: ° ° ° ° ° ° ° °Instructions after Knee Arthroscopy  ° ° James P. Hooten, Jr., M.D.    ° Dept. of Orthopaedics & Sports Medicine ° [facility] ° [address] ° [place], [place]  [address] ° ° Phone: [phone]   Fax: [phone] ° ° °DIET: °• Drink plenty of non-alcoholic fluids & begin a light diet. °• Resume your normal diet the day after surgery. ° °ACTIVITY:  °• You may use crutches or a walker with weight-bearing as tolerated, unless instructed otherwise. °• You may wean yourself off of the walker or crutches as tolerated.  °• Begin doing gentle exercises. Exercising will reduce the pain and swelling, increase motion, and prevent muscle weakness.   °• Avoid strenuous activities or athletics for a minimum of 4-6 weeks after arthroscopic surgery. °• Do not drive or operate any equipment until instructed. ° °WOUND CARE:  °• Place one to two pillows under the knee the first day or two when sitting or lying.  °• Continue to use the ice packs periodically to reduce pain and swelling. °• The small incisions in  your knee are closed with nylon stitches. The stitches will be removed in the office. °• The bulky dressing may be removed on the second day after surgery. DO NOT TOUCH THE STITCHES. Put a Band-Aid over each stitch. Do NOT use any ointments or creams on the incisions.  °• You may bathe or shower after the stitches are removed at the first office visit following surgery. ° °MEDICATIONS: °• You may resume your regular medications. °• Please take the pain medication as prescribed. °• Do not take pain medication on an empty stomach. °• Do not drive or drink alcoholic beverages when taking pain medications. ° °CALL THE OFFICE FOR: °• Temperature above 101 degrees °• Excessive bleeding or drainage on the dressing. °• Excessive swelling, coldness, or paleness of the toes. °• Persistent nausea and vomiting. ° °FOLLOW-UP:  °• You should have an appointment to return to the office in 7-10 days after surgery.  °  °

## 2017-05-08 NOTE — Anesthesia Post-op Follow-up Note (Cosign Needed)
Anesthesia QCDR form completed.        

## 2017-05-08 NOTE — Transfer of Care (Signed)
Immediate Anesthesia Transfer of Care Note  Patient: Cynthia Potts  Procedure(s) Performed: Procedure(s): ARTHROSCOPY KNEE, Tear of the posterior horn of medical meniscus, Partial medical meniscectomy (Left)  Patient Location: PACU  Anesthesia Type:General  Level of Consciousness: awake  Airway & Oxygen Therapy: Patient connected to face mask oxygen  Post-op Assessment: Post -op Vital signs reviewed and stable  Post vital signs: stable  Last Vitals:  Vitals:   05/08/17 1444 05/08/17 1832  BP: 126/73 (!) 109/57  Pulse: (!) 50 78  Resp: 16   Temp: (!) 36.1 C 36.7 C    Last Pain:  Vitals:   05/08/17 1832  TempSrc: Temporal         Complications: No apparent anesthesia complications

## 2017-05-08 NOTE — H&P (Signed)
The patient has been re-examined, and the chart reviewed, and there have been no interval changes to the documented history and physical.    The risks, benefits, and alternatives have been discussed at length. The patient expressed understanding of the risks benefits and agreed with plans for surgical intervention.  James P. Hooten, Jr. M.D.    

## 2017-05-08 NOTE — Anesthesia Procedure Notes (Signed)
Procedure Name: LMA Insertion Date/Time: 05/08/2017 4:45 PM Performed by: Jonna Clark Pre-anesthesia Checklist: Patient identified, Patient being monitored, Timeout performed, Emergency Drugs available and Suction available Patient Re-evaluated:Patient Re-evaluated prior to induction Oxygen Delivery Method: Circle system utilized Preoxygenation: Pre-oxygenation with 100% oxygen Induction Type: IV induction Ventilation: Mask ventilation without difficulty LMA: LMA inserted LMA Size: 3.5 Tube type: Oral Number of attempts: 1 Placement Confirmation: positive ETCO2 and breath sounds checked- equal and bilateral Tube secured with: Tape Dental Injury: Teeth and Oropharynx as per pre-operative assessment

## 2017-05-09 ENCOUNTER — Encounter: Payer: Self-pay | Admitting: Orthopedic Surgery

## 2017-05-10 NOTE — Anesthesia Postprocedure Evaluation (Signed)
Anesthesia Post Note  Patient: Cynthia Potts  Procedure(s) Performed: Procedure(s) (LRB): ARTHROSCOPY KNEE, Tear of the posterior horn of medical meniscus, Partial medical meniscectomy (Left)  Patient location during evaluation: PACU Anesthesia Type: General Level of consciousness: awake and alert Pain management: pain level controlled Vital Signs Assessment: post-procedure vital signs reviewed and stable Respiratory status: spontaneous breathing, nonlabored ventilation, respiratory function stable and patient connected to nasal cannula oxygen Cardiovascular status: blood pressure returned to baseline and stable Postop Assessment: no signs of nausea or vomiting Anesthetic complications: no     Last Vitals:  Vitals:   05/08/17 1950 05/08/17 2002  BP:  128/67  Pulse: 76 79  Resp: 16 20  Temp:      Last Pain:  Vitals:   05/09/17 0818  TempSrc:   PainSc: 2                  Molli Barrows

## 2017-09-27 IMAGING — MR MR KNEE*L* W/O CM
5 series · 38 of 40 positions shown · non-contrast
Comparison: None.

CLINICAL DATA: Medial left knee pain and tenderness with swelling.
This started in July 2016.

EXAM:
MRI OF THE LEFT KNEE WITHOUT CONTRAST
TECHNIQUE: Multiplanar, multisequence MR imaging of the knee was performed. No
intravenous contrast was administered.

[Series 3: PD fat-sat · axial · 3.0mm · 0.33mm/px · z∈[-59,+53]mm · 8 of 35 slices shown (1 of 3)]
[im 1/35]
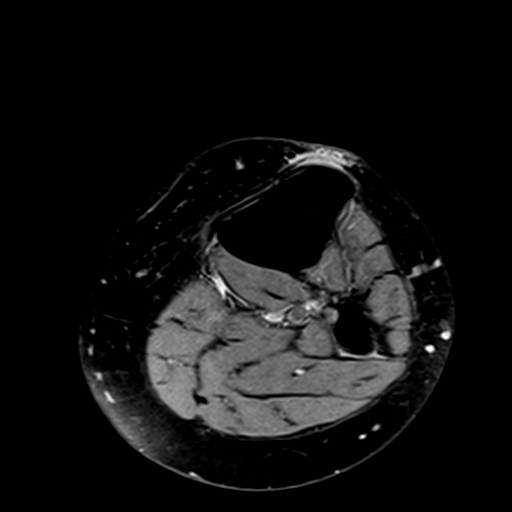
[im 5/35]
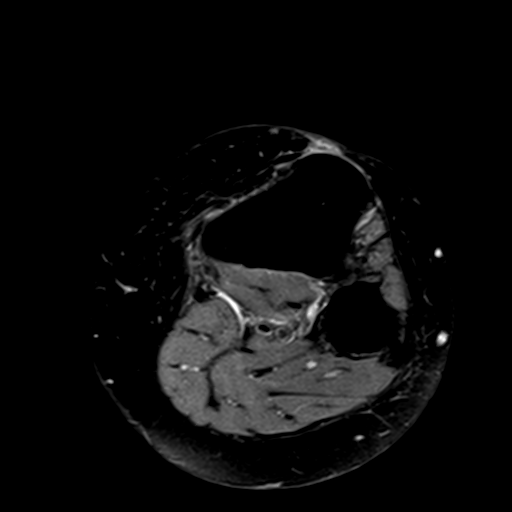
[im 10/35]
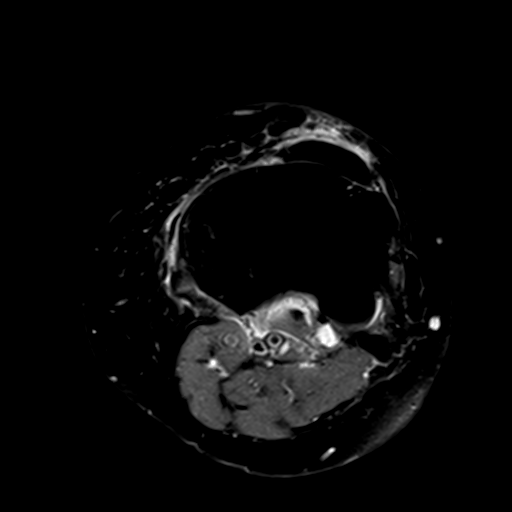
[im 15/35]
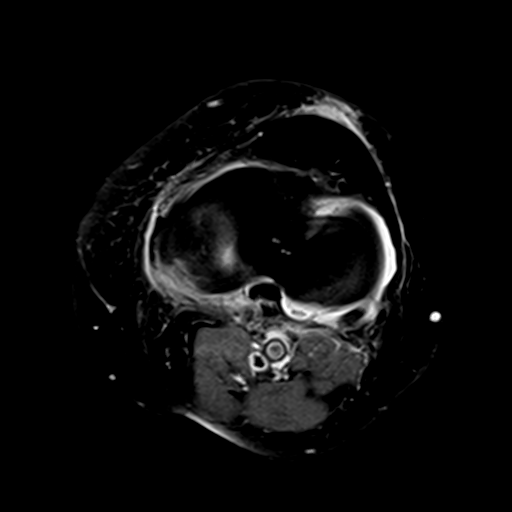
[im 20/35]
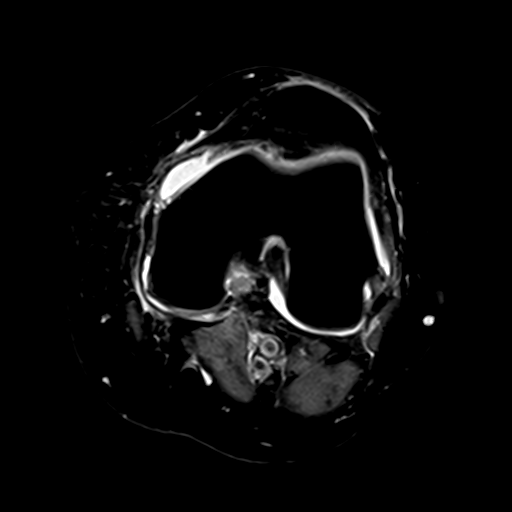
[im 25/35]
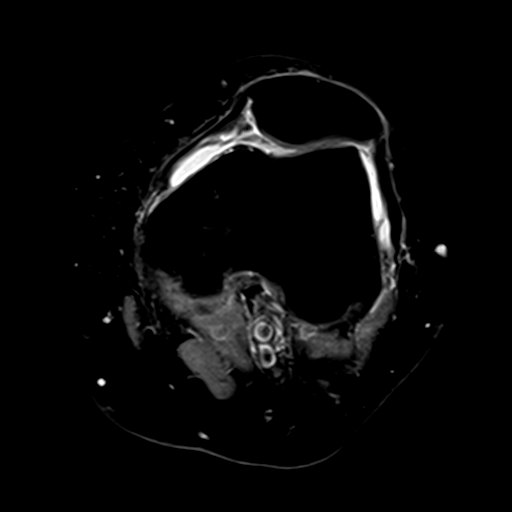
[im 30/35]
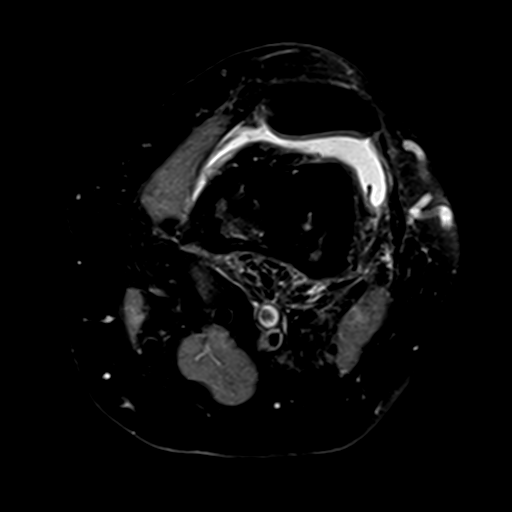
[im 35/35]
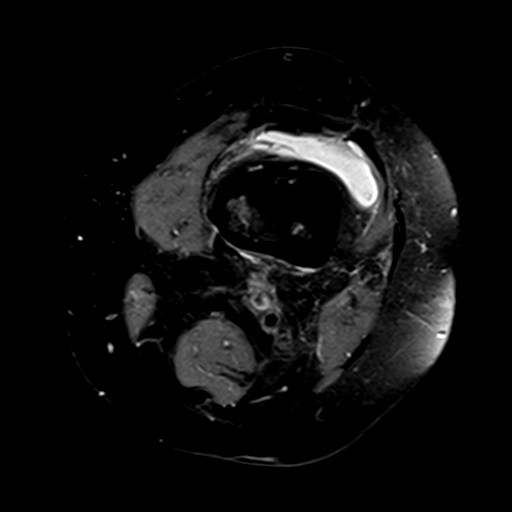

[Series 4: T1 · coronal · 3.0mm · 0.50mm/px · 5 of 31 slices shown]
[im 1/31]
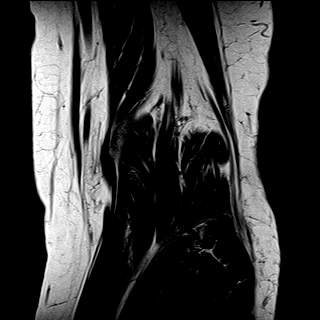
[im 6/31]
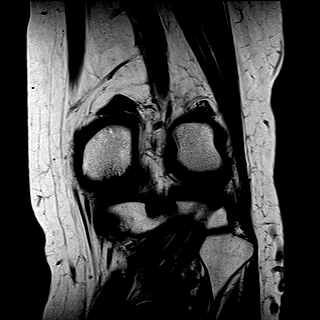
[im 11/31]
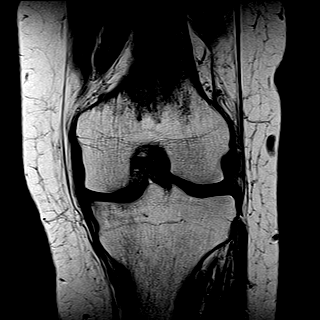
[im 16/31]
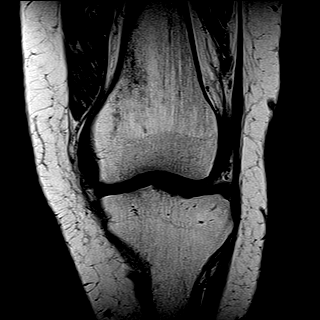
[im 21/31]
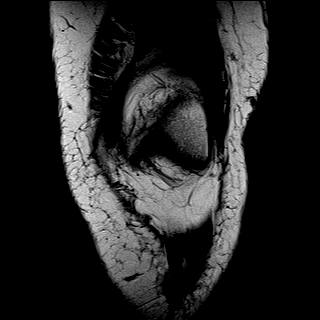

[Series 5: T2 fat-sat · coronal · 3.0mm · 0.50mm/px · 8 of 31 slices shown]
[im 1/31]
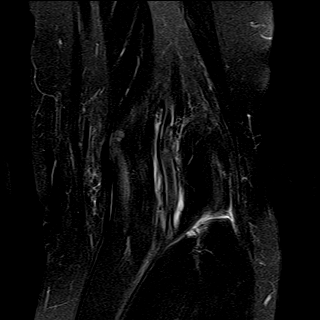
[im 5/31]
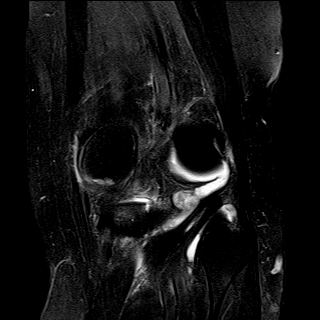
[im 9/31]
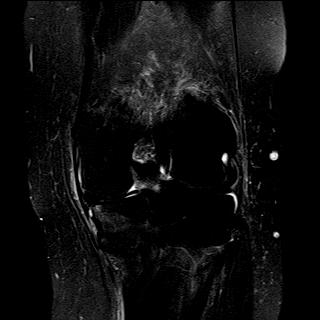
[im 13/31]
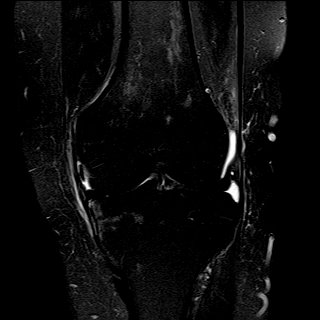
[im 18/31]
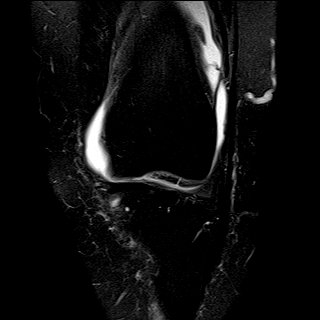
[im 22/31]
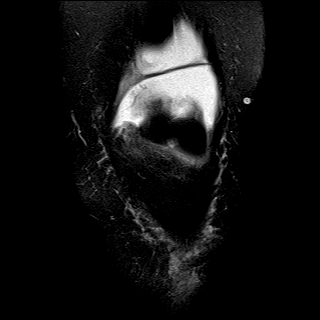
[im 26/31]
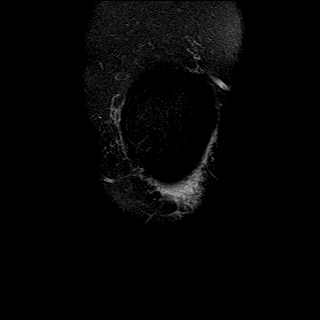
[im 31/31]
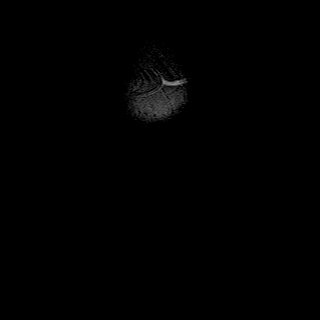

[Series 6: PD fat-sat · coronal · 3.0mm · 0.62mm/px · 8 of 31 slices shown (2 of 3)]
[im 1/31]
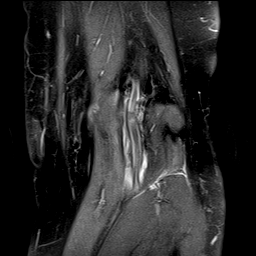
[im 5/31]
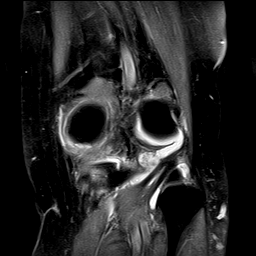
[im 9/31]
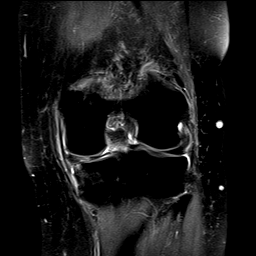
[im 13/31]
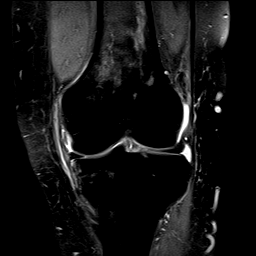
[im 18/31]
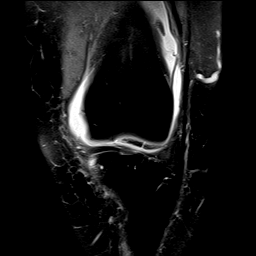
[im 22/31]
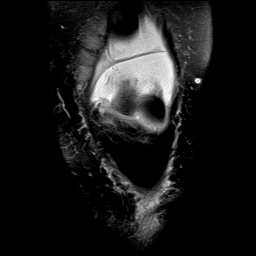
[im 26/31]
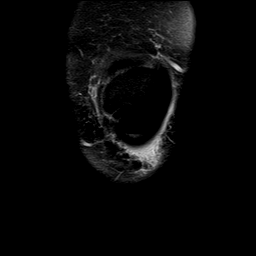
[im 31/31]
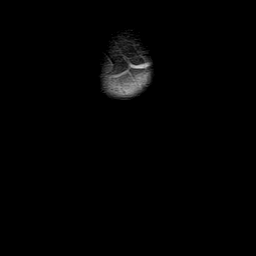

[Series 7: PD fat-sat · sagittal · 3.0mm · 0.62mm/px · 9 of 35 slices shown (3 of 3)]
[im 1/35]
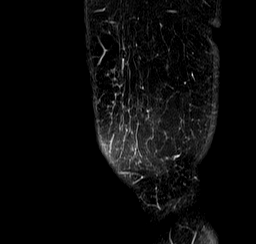
[im 5/35]
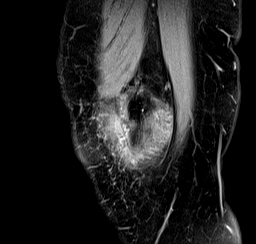
[im 9/35]
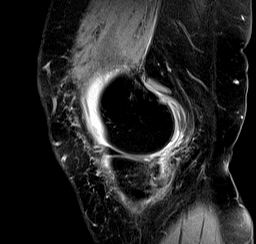
[im 13/35]
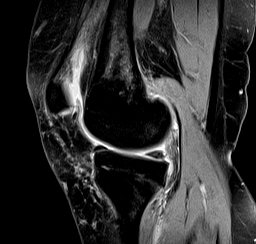
[im 18/35]
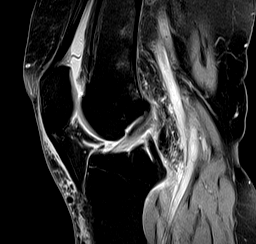
[im 22/35]
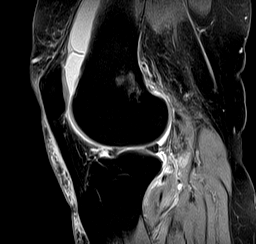
[im 26/35]
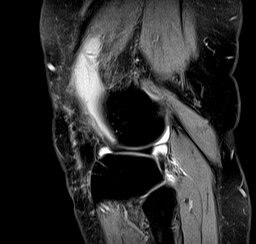
[im 30/35]
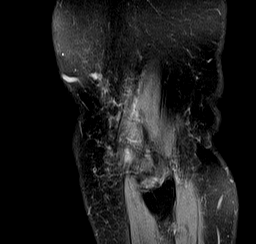
[im 35/35]
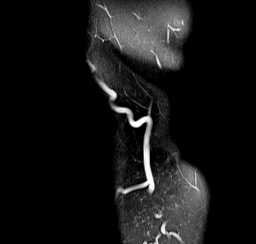

[38 of 40 positions shown; findings below may reference images not displayed]

FINDINGS: MENISCI

Medial meniscus: Irregular radial tear of the posterior horn, images
8 through 10 series 7, with some adjacent grade 3 signal extending
obliquely to the posterosuperior peripheral surface of the meniscus,
and mild free edge irregularity.

Lateral meniscus:  Unremarkable

LIGAMENTS

Cruciates:  Unremarkable

Collaterals: Edema tracks superficial to the slightly thickened MCL.

CARTILAGE

Patellofemoral: Mild chondral thinning medially along the femoral
trochlear groove.

Medial:  Mild to moderate degenerative chondral thinning.

Lateral:  Mild chondral thinning along the lateral femoral condyle.

Joint: Moderate knee effusion with superior plica. 0.5 by 0.8 by
cm chondral fragment posteromedial to the distal PCL on image [DATE].
Similar thin chondral fragment posterior to the lateral femoral
condyle on image [DATE].

Popliteal Fossa: Low-level edema infiltrates the popliteal space.
Very small Baker's cyst. Distal semimembranosus tendinopathy.

Extensor Mechanism: Mild focal irregularity in the distal lateral
quadriceps tendon on image [DATE].

Bones: Small degenerative subcortical lesion along the medial tibial
rim.

Other: No supplemental non-categorized findings.
IMPRESSION: 1. Irregular radial tear posterior horn medial meniscus.
2. Mild to moderate tricompartmental degenerative chondral thinning.
3. Potential sprain of the MCL with edema tracking superficial to
the slightly thickened MCL.
4. Moderate knee effusion with superior plica.
5. There are 2 small chondral fragments posteriorly in the knee
joint.
6. Very small Baker's cyst with adjacent distal semimembranosus
tendinopathy.
7. Linear irregularity in the distal lateral quadriceps tendon may
be residua from an old injury.

## 2020-04-04 ENCOUNTER — Ambulatory Visit: Payer: Medicare HMO | Admitting: Dermatology

## 2020-04-04 ENCOUNTER — Other Ambulatory Visit: Payer: Self-pay

## 2020-04-04 DIAGNOSIS — S30861A Insect bite (nonvenomous) of abdominal wall, initial encounter: Secondary | ICD-10-CM | POA: Diagnosis not present

## 2020-04-04 DIAGNOSIS — D485 Neoplasm of uncertain behavior of skin: Secondary | ICD-10-CM

## 2020-04-04 DIAGNOSIS — W57XXXA Bitten or stung by nonvenomous insect and other nonvenomous arthropods, initial encounter: Secondary | ICD-10-CM | POA: Diagnosis not present

## 2020-04-04 MED ORDER — DOXYCYCLINE HYCLATE 100 MG PO CAPS: 100.0000 mg | ORAL_CAPSULE | Freq: Two times a day (BID) | ORAL | 0 refills | Status: AC

## 2020-04-04 NOTE — Progress Notes (Signed)
° °  Follow-Up Visit   Subjective  Cynthia Potts is a 79 y.o. female who presents for the following: growth (groin area, noticed 1 week ago, asymptomatic).  The following portions of the chart were reviewed this encounter and updated as appropriate:      Review of Systems:  No other skin or systemic complaints except as noted in HPI or Assessment and Plan.  Objective  Well appearing patient in no apparent distress; mood and affect are within normal limits.  A focused examination was performed including groin. Relevant physical exam findings are noted in the Assessment and Plan.  Objective  Right Inguinal Groin: Attached tick, no surrounding erythema   Assessment & Plan  Neoplasm of uncertain behavior of skin Right Inguinal Groin  Skin / nail biopsy Type of biopsy: punch   Informed consent: discussed and consent obtained   Patient was prepped and draped in usual sterile fashion: Area prepped with alcohol. Anesthesia: the lesion was anesthetized in a standard fashion   Anesthetic:  1% lidocaine w/ epinephrine 1-100,000 buffered w/ 8.4% NaHCO3 Punch size:  2.5 mm Suture removal (days):  7 Hemostasis achieved with: pressure and aluminum chloride   Outcome: patient tolerated procedure well   Post-procedure details: wound care instructions given   Post-procedure details comment:  Ointment and pressure dressing applied  doxycycline (VIBRAMYCIN) 100 MG capsule  Specimen 1 - Surgical pathology Differential Diagnosis: Arthropod Bite vs other Check Margins: No Attached tick, no surrounding erythema *2 pieces  Tick removal attempted, but mouthparts/head were left in skin, so punch removal performed Start doxycycline 100mg  take 1 po BID x 7 days #14 0Rf.  Doxycycline should be taken with food to prevent nausea. Do not lay down for 30 minutes after taking. Be cautious with sun exposure and use good sun protection while on this medication. Pregnant women should not take this  medication.    Arthropod bite, initial encounter  Return if symptoms worsen or fail to improve.  IJamesetta Orleans, CMA, am acting as scribe for Brendolyn Patty, MD .  Documentation: I have reviewed the above documentation for accuracy and completeness, and I agree with the above.  Brendolyn Patty MD

## 2020-04-04 NOTE — Patient Instructions (Addendum)
Wound Care Instructions  1. Cleanse wound gently with soap and water once a day then pat dry with clean gauze. Apply a thing coat of Petrolatum (petroleum jelly, "Vaseline") over the wound (unless you have an allergy to this). We recommend that you use a new, sterile tube of Vaseline. Do not pick or remove scabs. Do not remove the yellow or white "healing tissue" from the base of the wound.  2. Cover the wound with fresh, clean, nonstick gauze and secure with paper tape. You may use Band-Aids in place of gauze and tape if the would is small enough, but would recommend trimming much of the tape off as there is often too much. Sometimes Band-Aids can irritate the skin.  3. You should call the office for your biopsy report after 1 week if you have not already been contacted.  4. If you experience any problems, such as abnormal amounts of bleeding, swelling, significant bruising, significant pain, or evidence of infection, please call the office immediately.      Doxycycline should be taken with food to prevent nausea. Do not lay down for 30 minutes after taking. Be cautious with sun exposure and use good sun protection while on this medication. Pregnant women should not take this medication.

## 2020-04-07 ENCOUNTER — Telehealth: Payer: Self-pay

## 2020-04-07 NOTE — Telephone Encounter (Signed)
-----   Message from Brendolyn Patty, MD sent at 04/06/2020  7:13 PM EDT ----- Skin , right inguinal groin TICK, WITH DERMAL INFLAMMATORY REACTION, SEE DESCRIPTION  Continue doxycycline as prescribed

## 2020-04-07 NOTE — Telephone Encounter (Signed)
Left pt msg to call for bx results/sh 

## 2020-04-07 NOTE — Telephone Encounter (Signed)
Patient advised of labs. She states the area is slightly swollen. We have scheduled her for a 7 day recheck on Monday 04/11/20.

## 2020-04-11 ENCOUNTER — Ambulatory Visit: Payer: Medicare HMO | Admitting: Dermatology

## 2020-04-14 ENCOUNTER — Ambulatory Visit: Payer: Medicare HMO | Admitting: Dermatology

## 2020-04-14 ENCOUNTER — Other Ambulatory Visit: Payer: Self-pay

## 2020-04-14 DIAGNOSIS — W57XXXD Bitten or stung by nonvenomous insect and other nonvenomous arthropods, subsequent encounter: Secondary | ICD-10-CM | POA: Diagnosis not present

## 2020-04-14 DIAGNOSIS — S30861D Insect bite (nonvenomous) of abdominal wall, subsequent encounter: Secondary | ICD-10-CM | POA: Diagnosis not present

## 2020-04-14 NOTE — Progress Notes (Signed)
   Follow-Up Visit   Subjective  Cynthia Potts is a 79 y.o. female who presents for the following: Insect Bite.   Patient presents for follow up from [place]  04/04/20 for punch bx proven tick. Patient completed course of Doxycycline 100 mg BID for 7 days on Monday  The following portions of the chart were reviewed this encounter and updated as appropriate:      Review of Systems:  No other skin or systemic complaints except as noted in HPI or Assessment and Plan.  Objective  Well appearing patient in no apparent distress; mood and affect are within normal limits.  A focused examination was performed including Right Inguinal groin. Relevant physical exam findings are noted in the Assessment and Plan.  Objective  Right Inguinal groin: Well healed with slight induration at inferior edge   Assessment & Plan  Tick bite of groin, subsequent encounter Right Inguinal groin  Reassured pt that tick has been completely removed with punch bx and residual firmness is from inflammation r/t the tick bite.  It will gradually flatten with time.  If not improved after 2 months, advised pt to RTC for recheck. Sample given: Cloderm apply to affected area twice daily until gone.  Recommend using insect repellent with DEET when outside to repel ticks   Return if symptoms worsen or fail to improve.  Marene Lenz, CMA, am acting as scribe for Brendolyn Patty, MD .  Documentation: I have reviewed the above documentation for accuracy and completeness, and I agree with the above.  Brendolyn Patty MD

## 2022-06-13 ENCOUNTER — Ambulatory Visit: Payer: Medicare HMO | Admitting: Dermatology

## 2023-04-03 ENCOUNTER — Ambulatory Visit: Payer: Medicare HMO | Admitting: Dermatology

## 2023-04-03 VITALS — BP 125/77 | HR 66

## 2023-04-03 DIAGNOSIS — L821 Other seborrheic keratosis: Secondary | ICD-10-CM

## 2023-04-03 DIAGNOSIS — L719 Rosacea, unspecified: Secondary | ICD-10-CM

## 2023-04-03 DIAGNOSIS — L409 Psoriasis, unspecified: Secondary | ICD-10-CM

## 2023-04-03 MED ORDER — BETAMETHASONE DIPROPIONATE 0.05 % EX CREA: TOPICAL_CREAM | CUTANEOUS | 3 refills | Status: DC

## 2023-04-03 MED ORDER — IVERMECTIN 1 % EX CREA: TOPICAL_CREAM | CUTANEOUS | 6 refills | Status: DC

## 2023-04-03 NOTE — Patient Instructions (Addendum)
Apply Zoryve once a day to affected areas psoriasis on face.   Rosacea  What is rosacea? Rosacea (say: ro-zay-sha) is a common skin disease that usually begins as a trend of flushing or blushing easily.  As rosacea progresses, a persistent redness in the center of the face will develop and may gradually spread beyond the nose and cheeks to the forehead and chin.  In some cases, the ears, chest, and back could be affected.  Rosacea may appear as tiny blood vessels or small red bumps that occur in crops.  Frequently they can contain pus, and are called "pustules".  If the bumps do not contain pus, they are referred to as "papules".  Rarely, in prolonged, untreated cases of rosacea, the oil glands of the nose and cheeks may become permanently enlarged.  This is called rhinophyma, and is seen more frequently in men.  Signs and Risks In its beginning stages, rosacea tends to come and go, which makes it difficult to recognize.  It can start as intermittent flushing of the face.  Eventually, blood vessels may become permanently visible.  Pustules and papules can appear, but can be mistaken for adult acne.  People of all races, ages, genders and ethnic groups are at risk of developing rosacea.  However, it is more common in women (especially around menopause) and adults with fair skin between the ages of 62 and 44.  Treatment Dermatologists typically recommend a combination of treatments to effectively manage rosacea.  Treatment can improve symptoms and may stop the progression of the rosacea.  Treatment may involve both topical and oral medications.  The tetracycline antibiotics are often used for their anti-inflammatory effect; however, because of the possibility of developing antibiotic resistance, they should not be used long term at full dose.  For dilated blood vessels the options include electrodessication (uses electric current through a small needle), laser treatment, and cosmetics to hide the redness.    With all forms of treatment, improvement is a slow process, and patients may not see any results for the first 3-4 weeks.  It is very important to avoid the sun and other triggers.  Patients must wear sunscreen daily.  Skin Care Instructions: Cleanse the skin with a mild soap such as CeraVe cleanser, Cetaphil cleanser, or Dove soap once or twice daily as needed. Moisturize with Eucerin Redness Relief Daily Perfecting Lotion (has a subtle green tint), CeraVe Moisturizing Cream, or Oil of Olay Daily Moisturizer with sunscreen every morning and/or night as recommended. Makeup should be "non-comedogenic" (won't clog pores) and be labeled "for sensitive skin". Good choices for cosmetics are: Neutrogena, Almay, and Physician's Formula.  Any product with a green tint tends to offset a red complexion. If your eyes are dry and irritated, use artificial tears 2-3 times per day and cleanse the eyelids daily with baby shampoo.  Have your eyes examined at least every 2 years.  Be sure to tell your eye doctor that you have rosacea. Alcoholic beverages tend to cause flushing of the skin, and may make rosacea worse. Always wear sunscreen, protect your skin from extreme hot and cold temperatures, and avoid spicy foods, hot drinks, and mechanical irritation such as rubbing, scrubbing, or massaging the face.  Avoid harsh skin cleansers, cleansing masks, astringents, and exfoliation. If a particular product burns or makes your face feel tight, then it is likely to flare your rosacea. If you are having difficulty finding a sunscreen that you can tolerate, you may try switching to a chemical-free sunscreen.  These are ones whose active ingredient is zinc oxide or titanium dioxide only.  They should also be fragrance free, non-comedogenic, and labeled for sensitive skin. Rosacea triggers may vary from person to person.  There are a variety of foods that have been reported to trigger rosacea.  Some patients find that keeping a  diary of what they were doing when they flared helps them avoid triggers.   Due to recent changes in healthcare laws, you may see results of your pathology and/or laboratory studies on MyChart before the doctors have had a chance to review them. We understand that in some cases there may be results that are confusing or concerning to you. Please understand that not all results are received at the same time and often the doctors may need to interpret multiple results in order to provide you with the best plan of care or course of treatment. Therefore, we ask that you please give [place] 2 business days to thoroughly review all your results before contacting the office for clarification. Should we see a critical lab result, you will be contacted sooner.   If You Need Anything After Your Visit  If you have any questions or concerns for your doctor, please call our main line at [phone] and press option 4 to reach your doctor's medical assistant. If no one answers, please leave a voicemail as directed and we will return your call as soon as possible. Messages left after 4 pm will be answered the following business day.   You may also send [place] a message via MyChart. We typically respond to MyChart messages within 1-2 business days.  For prescription refills, please ask your pharmacy to contact our office. Our fax number is [phone].  If you have an urgent issue when the clinic is closed that cannot wait until the next business day, you can page your doctor at the number below.    Please note that while we do our best to be available for urgent issues outside of office hours, we are not available 24/7.   If you have an urgent issue and are unable to reach [place], you may choose to seek medical care at your doctor's office, retail clinic, urgent care center, or emergency room.  If you have a medical emergency, please immediately call 911 or go to the emergency department.  Pager Numbers  - Dr.  Gwen Pounds: [phone]  - Dr. Neale Burly: [phone]  - Dr. Roseanne Reno: [phone]  In the event of inclement weather, please call our main line at [phone] for an update on the status of any delays or closures.  Dermatology Medication Tips: Please keep the boxes that topical medications come in in order to help keep track of the instructions about where and how to use these. Pharmacies typically print the medication instructions only on the boxes and not directly on the medication tubes.   If your medication is too expensive, please contact our office at [phone] option 4 or send [place] a message through MyChart.   We are unable to tell what your co-pay for medications will be in advance as this is different depending on your insurance coverage. However, we may be able to find a substitute medication at lower cost or fill out paperwork to get insurance to cover a needed medication.   If a prior authorization is required to get your medication covered by your insurance company, please allow [place] 1-2 business days to complete this process.  Drug prices often vary depending on where the  prescription is filled and some pharmacies may offer cheaper prices.  The website www.goodrx.com contains coupons for medications through different pharmacies. The prices here do not account for what the cost may be with help from insurance (it may be cheaper with your insurance), but the website can give you the price if you did not use any insurance.  - You can print the associated coupon and take it with your prescription to the pharmacy.  - You may also stop by our office during regular business hours and pick up a GoodRx coupon card.  - If you need your prescription sent electronically to a different pharmacy, notify our office through [facility] or by phone at [phone] option 4.     Si Usted Necesita Algo Despus de Su Visita  Tambin puede enviarnos un mensaje a travs de Clinical cytogeneticist. Por lo  general respondemos a los mensajes de MyChart en el transcurso de 1 a 2 das hbiles.  Para renovar recetas, por favor pida a su farmacia que se ponga en contacto con nuestra oficina. Annie Sable de fax es [place] [phone].  Si tiene un asunto urgente cuando la clnica est cerrada y que no puede esperar hasta el siguiente da hbil, puede llamar/localizar a su doctor(a) al nmero que aparece a continuacin.   Por favor, tenga en cuenta que aunque hacemos todo lo posible para estar disponibles para asuntos urgentes fuera del horario de [place], no estamos disponibles las 24 horas del da, los 7 [address]  [address] de la [place].   Si tiene un problema urgente y no puede comunicarse con nosotros, puede optar por buscar atencin mdica  en el consultorio de su doctor(a), en una clnica privada, en un centro de atencin urgente o en una sala de emergencias.  Si tiene Engineer, drilling, por favor llame inmediatamente al 911 o vaya a la sala de emergencias.  Nmeros de bper  - Dr. Gwen Pounds: [phone]  - Dra. Moye: [phone]  - Dra. Roseanne Reno: [phone]  En caso de inclemencias del [place], por favor llame a Lacy Duverney principal al [phone] para una actualizacin sobre el [place] de cualquier retraso o cierre.  Consejos para la medicacin en dermatologa: Por favor, guarde las cajas en las que vienen los medicamentos de uso tpico para ayudarle a seguir las instrucciones sobre dnde y cmo usarlos. Las farmacias generalmente imprimen las instrucciones del medicamento slo en las cajas y no directamente en los tubos del [place].   Si su medicamento es muy caro, por favor, pngase en contacto con Rolm Gala llamando al [phone] y presione la opcin 4 o envenos un mensaje a travs de Clinical cytogeneticist.   No podemos decirle cul ser su copago por los medicamentos por adelantado ya que esto es diferente dependiendo de la cobertura de su seguro. Sin embargo, es posible que podamos encontrar un  medicamento sustituto a Audiological scientist un formulario para que el seguro cubra el medicamento que se considera necesario.   Si se requiere una autorizacin previa para que su compaa de seguros [place] su medicamento, por favor permtanos de 1 a 2 das hbiles para completar [address]  Los precios de los medicamentos varan con frecuencia dependiendo del Environmental consultant de dnde se surte la receta y alguna farmacias pueden ofrecer precios ms baratos.  El sitio web www.goodrx.com tiene cupones para medicamentos de Health and safety inspector. Los precios aqu no tienen en cuenta lo que podra costar con la ayuda del seguro (puede ser ms barato con su seguro), pero el sitio web  puede darle el precio si no utiliz [employer].  - Puede imprimir el cupn correspondiente y llevarlo con su receta a la farmacia.  - Tambin puede pasar por nuestra oficina durante el horario de atencin regular y Charity fundraiser una tarjeta de cupones de GoodRx.  - Si necesita que su receta se enve electrnicamente a una farmacia diferente, informe a nuestra oficina a travs de MyChart de [place] o por telfono llamando al [phone] y presione la opcin 4.

## 2023-04-03 NOTE — Progress Notes (Signed)
Follow-Up Visit   Subjective  Cynthia Potts is a 82 y.o. female who presents for the following: Rosacea of the face. She has tried metronidazole cream in the past from her PCP, but that didn't help. She tried her daughter's Soolantra Cream and that helped some. Face is improved today, but she gets white blisters on her cheeks, nose, and around her mouth. Patient was on Enbrel for years for psoriatic arthritis. She also has a rash that comes up on her face and trunk. She has used halobetasol cream to theses areas that she's had for several years. She has h/o psoriasis.  The patient also has a spot she would like checked on her right forearm.    The following portions of the chart were reviewed this encounter and updated as appropriate: medications, allergies, medical history  Review of Systems:  No other skin or systemic complaints except as noted in HPI or Assessment and Plan.  Objective  Well appearing patient in no apparent distress; mood and affect are within normal limits.   A focused examination was performed of the following areas: Face, arms, abdomen  Relevant exam findings are noted in the Assessment and Plan.    Assessment & Plan   ROSACEA Exam Mid face erythema with telangiectasias  Chronic and persistent condition with duration or expected duration over one year. Condition is symptomatic/ bothersome to patient. Not currently at goal.   Rosacea is a chronic progressive skin condition usually affecting the face of adults, causing redness and/or acne bumps. It is treatable but not curable. It sometimes affects the eyes (ocular rosacea) as well. It may respond to topical and/or systemic medication and can flare with stress, sun exposure, alcohol, exercise, topical steroids (including hydrocortisone/cortisone 10) and some foods.  Daily application of broad spectrum spf 30+ sunscreen to face is recommended to reduce flares.  Patient denies grittiness of the  eyes  Treatment Plan Apply Soolantra cream qhs to affected areas at face. Pt has tried and failed metrocream.   PSORIASIS Exam: Pink scaly macule on the right perioral ,<1% BSA.  Chronic and persistent condition with duration or expected duration over one year. Condition is symptomatic/ bothersome to patient. Not currently at goal.   Patient denies joint pain at this time, but has a history of psoriatic arthritis.  Psoriasis is a chronic non-curable, but treatable genetic/hereditary disease that may have other systemic features affecting other organ systems such as joints (Psoriatic Arthritis). It is associated with an increased risk of inflammatory bowel disease, heart disease, non-alcoholic fatty liver disease, and depression.  Treatments include light and laser treatments; topical medications; and systemic medications including oral and injectables.  Treatment Plan: Start Zoryve Cream - Apply once daily to AA psoriasis on face. Lot WBBD Exp 12/2023, samples x 2 given today.  Start betamethasone dipropionate cream Apply to AA psoriasis on body once to twice daily until improved dsp 45g 3Rf. Avoid applying to face, groin, and axilla. Use as directed. Long-term use can cause thinning of the skin.  Topical steroids (such as triamcinolone, fluocinolone, fluocinonide, mometasone, clobetasol, halobetasol, betamethasone, hydrocortisone) can cause thinning and lightening of the skin if they are used for too long in the same area. Your physician has selected the right strength medicine for your problem and area affected on the body. Please use your medication only as directed by your physician to prevent side effects.    SEBORRHEIC KERATOSIS - Stuck-on, waxy, tan-brown papules and/or plaques, including right forearm (inflamed) - Benign-appearing - Discussed  benign etiology and prognosis. - Observe - Call for any changes - If becomes itchy, may use betamethasone dipropionate cream BID until  improved   Return if symptoms worsen or fail to improve.  ICherlyn Labella, CMA, am acting as scribe for Willeen Niece, MD .   Documentation: I have reviewed the above documentation for accuracy and completeness, and I agree with the above.  Willeen Niece, MD

## 2023-07-11 ENCOUNTER — Encounter: Payer: Self-pay | Admitting: Orthopedic Surgery

## 2023-08-05 ENCOUNTER — Ambulatory Visit: Payer: Medicare HMO | Admitting: Dermatology

## 2023-08-21 ENCOUNTER — Ambulatory Visit: Payer: Medicare HMO | Admitting: Dermatology

## 2023-08-21 DIAGNOSIS — L710 Perioral dermatitis: Secondary | ICD-10-CM | POA: Diagnosis not present

## 2023-08-21 DIAGNOSIS — L820 Inflamed seborrheic keratosis: Secondary | ICD-10-CM

## 2023-08-21 DIAGNOSIS — L719 Rosacea, unspecified: Secondary | ICD-10-CM | POA: Diagnosis not present

## 2023-08-21 DIAGNOSIS — L821 Other seborrheic keratosis: Secondary | ICD-10-CM

## 2023-08-21 MED ORDER — SULFACETAMIDE SODIUM (ACNE) 10 % EX LOTN: TOPICAL_LOTION | CUTANEOUS | 1 refills | Status: DC

## 2023-08-21 MED ORDER — KETOCONAZOLE 2 % EX CREA: TOPICAL_CREAM | CUTANEOUS | 1 refills | Status: DC

## 2023-08-21 MED ORDER — DOXYCYCLINE MONOHYDRATE 100 MG PO CAPS: ORAL_CAPSULE | ORAL | 1 refills | Status: DC

## 2023-08-21 NOTE — Progress Notes (Signed)
Follow-Up Visit   Subjective  Cynthia Potts is a 82 y.o. female who presents for the following: patient here today concerning rash that comes and goes at corner of mouth. Was seen several months ago in June for same condition and was given a Zoryve cream sample which didn't help.  Has been using Soolantra cream at affected areas and HC cream as needed, neither of which are helping. Patient also noticed a spot at right hand at end of October.   Patient has history of rosacea and uses Soolantra cream.   The patient has spots, moles and lesions to be evaluated, some may be new or changing and the patient may have concern these could be cancer.   The following portions of the chart were reviewed this encounter and updated as appropriate: medications, allergies, medical history  Review of Systems:  No other skin or systemic complaints except as noted in HPI or Assessment and Plan.  Objective  Well appearing patient in no apparent distress; mood and affect are within normal limits.   A focused examination was performed of the following areas: Face, right hand  Relevant exam findings are noted in the Assessment and Plan.    Assessment & Plan    Perioral dermatitis  Exam: mild erythema with few tiny pink papules at right perioral chin and right commissure, no scale  Chronic and persistent condition with duration or expected duration over one year. Condition is symptomatic/ bothersome to patient. Not currently at goal.   Perioral dermatitis is an eruption which is usually located around the mouth and nose.  It can be a rash and/or red bumps.  It occasionally occurs around the eyes.  It may be itchy and may burn.  The exact cause is unknown.  Some types of makeup, moisturizers, dental products, and prescription creams may be partially responsible for the eruption.  Topical steroids such as cortisone creams can temporarily make the rash better but with discontinuation the rash tends to  recur and worsen, so they should be avoided. Topical antibiotics, elidel cream, protopic ointment, and oral antibiotics may be prescribed to treat this condition.  Although perioral dermatitis is not an infection, some antibiotics have anti-inflammatory properties that help it greatly.  Treatment Plan:  Stop hydrocortisone 1 % cream Start sulfacetamide sodium acne 10 % lotion Apply topically to affected areas of rash once to twice daily as needed.  Start ketoconzole cream 2 % apply once to twice daily to rash at corner of mouth Start doxycycline 100 mg take 1 capsule by mouth daily with food.  Doxycycline should be taken with food to prevent nausea. Do not lay down for 30 minutes after taking. Be cautious with sun exposure and use good sun protection while on this medication. Pregnant women should not take this medication.   ROSACEA Exam Mid face erythema with telangiectasias  Chronic condition with duration or expected duration over one year. Currently well-controlled.   Rosacea is a chronic progressive skin condition usually affecting the face of adults, causing redness and/or acne bumps. It is treatable but not curable. It sometimes affects the eyes (ocular rosacea) as well. It may respond to topical and/or systemic medication and can flare with stress, sun exposure, alcohol, exercise, topical steroids (including hydrocortisone/cortisone 10) and some foods.  Daily application of broad spectrum spf 30+ sunscreen to face is recommended to reduce flares.  Patient denies grittiness of the eyes  Treatment Plan Continue Soolantra (ivermectin) cream at bedtime    INFLAMED SEBORRHEIC  KERATOSIS At right dorsal hand  - self resolved and clear today - observe for recurrence  SEBORRHEIC KERATOSIS - Stuck-on, waxy, tan-brown papules and/or plaques  - Benign-appearing - Discussed benign etiology and prognosis. - Observe - Call for any changes   Return if symptoms worsen or fail to  improve.  I, Asher Muir, CMA, am acting as scribe for Willeen Niece, MD.   Documentation: I have reviewed the above documentation for accuracy and completeness, and I agree with the above.  Willeen Niece, MD

## 2023-08-21 NOTE — Patient Instructions (Addendum)
For rash  Stop using hydrocortisone 1 or cortisone   Start ketoconzole cream 2 % apply once to twice daily to rash at corner of mouth  Start doxycycline 100 mg take 1 cap by mouth daily with food. Doxycycline should be taken with food to prevent nausea. Do not lay down for 30 minutes after taking. Be cautious with sun exposure and use good sun protection while on this medication. Pregnant women should not take this medication.   Start sulfacetamide sodium acne 10 % lotion   Apply topically to affected areas of rash once to twice daily as needed.      Perioral dermatitis is an eruption which is usually located around the mouth and nose.  It can be a rash and/or red bumps.  It occasionally occurs around the eyes.  It may be itchy and may burn.  The exact cause is unknown.  Some types of makeup, moisturizers, dental products, and prescription creams may be partially responsible for the eruption.  Topical steroids such as cortisone creams can temporarily make the rash better but with discontinuation the rash tends to recur and worsen, so they should be avoided. Topical antibiotics, elidel cream, protopic ointment, and oral antibiotics may be prescribed to treat this condition.  Although perioral dermatitis is not an infection, some antibiotics have anti-inflammatory properties that help it greatly.      Due to recent changes in healthcare laws, you may see results of your pathology and/or laboratory studies on MyChart before the doctors have had a chance to review them. We understand that in some cases there may be results that are confusing or concerning to you. Please understand that not all results are received at the same time and often the doctors may need to interpret multiple results in order to provide you with the best plan of care or course of treatment. Therefore, we ask that you please give [place] 2 business days to thoroughly review all your results before contacting the office for  clarification. Should we see a critical lab result, you will be contacted sooner.   If You Need Anything After Your Visit  If you have any questions or concerns for your doctor, please call our main line at [phone] and press option 4 to reach your doctor's medical assistant. If no one answers, please leave a voicemail as directed and we will return your call as soon as possible. Messages left after 4 pm will be answered the following business day.   You may also send [place] a message via MyChart. We typically respond to MyChart messages within 1-2 business days.  For prescription refills, please ask your pharmacy to contact our office. Our fax number is [phone].  If you have an urgent issue when the clinic is closed that cannot wait until the next business day, you can page your doctor at the number below.    Please note that while we do our best to be available for urgent issues outside of office hours, we are not available 24/7.   If you have an urgent issue and are unable to reach [place], you may choose to seek medical care at your doctor's office, retail clinic, urgent care center, or emergency room.  If you have a medical emergency, please immediately call 911 or go to the emergency department.  Pager Numbers  - Dr. Gwen Pounds: [phone]  - Dr. Roseanne Reno: [phone]  - Dr. Katrinka Blazing: [phone]   In the event of inclement weather, please call our main line at [phone]  for an update on the status of any delays or closures.  Dermatology Medication Tips: Please keep the boxes that topical medications come in in order to help keep track of the instructions about where and how to use these. Pharmacies typically print the medication instructions only on the boxes and not directly on the medication tubes.   If your medication is too expensive, please contact our office at [phone] option 4 or send [place] a message through MyChart.   We are unable to tell what your co-pay for  medications will be in advance as this is different depending on your insurance coverage. However, we may be able to find a substitute medication at lower cost or fill out paperwork to get insurance to cover a needed medication.   If a prior authorization is required to get your medication covered by your insurance company, please allow [place] 1-2 business days to complete this process.  Drug prices often vary depending on where the prescription is filled and some pharmacies may offer cheaper prices.  The website www.goodrx.com contains coupons for medications through different pharmacies. The prices here do not account for what the cost may be with help from insurance (it may be cheaper with your insurance), but the website can give you the price if you did not use any insurance.  - You can print the associated coupon and take it with your prescription to the pharmacy.  - You may also stop by our office during regular business hours and pick up a GoodRx coupon card.  - If you need your prescription sent electronically to a different pharmacy, notify our office through [facility] or by phone at [phone] option 4.     Si Usted Necesita Algo Despus de Su Visita  Tambin puede enviarnos un mensaje a travs de Clinical cytogeneticist. Por lo general respondemos a los mensajes de MyChart en el transcurso de 1 a 2 das hbiles.  Para renovar recetas, por favor pida a su farmacia que se ponga en contacto con nuestra oficina. Annie Sable de fax es [place] [phone].  Si tiene un asunto urgente cuando la clnica est cerrada y que no puede esperar hasta el siguiente da hbil, puede llamar/localizar a su doctor(a) al nmero que aparece a continuacin.   Por favor, tenga en cuenta que aunque hacemos todo lo posible para estar disponibles para asuntos urgentes fuera del horario de [place], no estamos disponibles las 24 horas del da, los 7 [address]  [address] de la [place].   Si tiene un problema urgente y no puede  comunicarse con nosotros, puede optar por buscar atencin mdica  en el consultorio de su doctor(a), en una clnica privada, en un centro de atencin urgente o en una sala de emergencias.  Si tiene Engineer, drilling, por favor llame inmediatamente al 911 o vaya a la sala de emergencias.  Nmeros de bper  - Dr. Gwen Pounds: [phone]  - Dra. Roseanne Reno: [phone]  - Dr. Katrinka Blazing: [phone]   En caso de inclemencias del tiempo, por favor llame a Lacy Duverney principal al [phone] para una actualizacin sobre el [place] de cualquier retraso o cierre.  Consejos para la medicacin en dermatologa: Por favor, guarde las cajas en las que vienen los medicamentos de uso tpico para ayudarle a seguir las instrucciones sobre dnde y cmo usarlos. Las farmacias generalmente imprimen las instrucciones del medicamento slo en las cajas y no directamente en los tubos del [place].   Si su medicamento es [employer], por favor, pngase en contacto con  nuestra oficina llamando al [phone] y presione la opcin 4 o envenos un mensaje a travs de Clinical cytogeneticist.   No podemos decirle cul ser su copago por los medicamentos por adelantado ya que esto es diferente dependiendo de la cobertura de su seguro. Sin embargo, es posible que podamos encontrar un medicamento sustituto a Audiological scientist un formulario para que el seguro cubra el medicamento que se considera necesario.   Si se requiere una autorizacin previa para que su compaa de seguros [place] su medicamento, por favor permtanos de 1 a 2 das hbiles para completar [address]  Los precios de los medicamentos varan con frecuencia dependiendo del Environmental consultant de dnde se surte la receta y alguna farmacias pueden ofrecer precios ms baratos.  El sitio web www.goodrx.com tiene cupones para medicamentos de Health and safety inspector. Los precios aqu no tienen en cuenta lo que podra costar con la ayuda del seguro (puede ser ms barato con su seguro), pero  el sitio web puede darle el precio si no utiliz Tourist information centre manager.  - Puede imprimir el cupn correspondiente y llevarlo con su receta a la farmacia.  - Tambin puede pasar por nuestra oficina durante el horario de atencin regular y Education officer, museum una tarjeta de cupones de GoodRx.  - Si necesita que su receta se enve electrnicamente a una farmacia diferente, informe a nuestra oficina a travs de MyChart de [place] o por telfono llamando al [phone] y presione la opcin 4.

## 2023-11-15 DIAGNOSIS — M1712 Unilateral primary osteoarthritis, left knee: Secondary | ICD-10-CM | POA: Diagnosis not present

## 2023-11-15 DIAGNOSIS — D6832 Hemorrhagic disorder due to extrinsic circulating anticoagulants: Secondary | ICD-10-CM | POA: Diagnosis not present

## 2023-11-19 DIAGNOSIS — M25562 Pain in left knee: Secondary | ICD-10-CM | POA: Diagnosis not present

## 2023-11-19 DIAGNOSIS — M1711 Unilateral primary osteoarthritis, right knee: Secondary | ICD-10-CM | POA: Diagnosis not present

## 2023-11-19 DIAGNOSIS — G8929 Other chronic pain: Secondary | ICD-10-CM | POA: Diagnosis not present

## 2023-11-19 DIAGNOSIS — M25561 Pain in right knee: Secondary | ICD-10-CM | POA: Diagnosis not present

## 2023-11-19 DIAGNOSIS — M1712 Unilateral primary osteoarthritis, left knee: Secondary | ICD-10-CM | POA: Diagnosis not present

## 2024-01-14 ENCOUNTER — Ambulatory Visit: Payer: Medicare HMO | Admitting: Nurse Practitioner

## 2024-01-22 ENCOUNTER — Encounter: Payer: Self-pay | Admitting: Nurse Practitioner

## 2024-01-22 ENCOUNTER — Ambulatory Visit (INDEPENDENT_AMBULATORY_CARE_PROVIDER_SITE_OTHER): Payer: Medicare HMO | Admitting: Nurse Practitioner

## 2024-01-22 VITALS — BP 96/60 | HR 75 | Temp 97.6°F | Ht 62.5 in | Wt 135.8 lb

## 2024-01-22 DIAGNOSIS — K219 Gastro-esophageal reflux disease without esophagitis: Secondary | ICD-10-CM | POA: Insufficient documentation

## 2024-01-22 DIAGNOSIS — Z872 Personal history of diseases of the skin and subcutaneous tissue: Secondary | ICD-10-CM | POA: Diagnosis not present

## 2024-01-22 DIAGNOSIS — Z1329 Encounter for screening for other suspected endocrine disorder: Secondary | ICD-10-CM

## 2024-01-22 DIAGNOSIS — M170 Bilateral primary osteoarthritis of knee: Secondary | ICD-10-CM | POA: Insufficient documentation

## 2024-01-22 DIAGNOSIS — E559 Vitamin D deficiency, unspecified: Secondary | ICD-10-CM | POA: Insufficient documentation

## 2024-01-22 DIAGNOSIS — G8929 Other chronic pain: Secondary | ICD-10-CM | POA: Insufficient documentation

## 2024-01-22 DIAGNOSIS — I482 Chronic atrial fibrillation, unspecified: Secondary | ICD-10-CM | POA: Insufficient documentation

## 2024-01-22 DIAGNOSIS — I480 Paroxysmal atrial fibrillation: Secondary | ICD-10-CM | POA: Diagnosis not present

## 2024-01-22 DIAGNOSIS — Z13220 Encounter for screening for lipoid disorders: Secondary | ICD-10-CM

## 2024-01-22 LAB — LIPID PANEL
Cholesterol: 191 mg/dL (ref 0–200)
HDL: 82.7 mg/dL (ref >39.00)
LDL Cholesterol: 90 mg/dL (ref 0–99)
NonHDL: 108.23
Total CHOL/HDL Ratio: 2
Triglycerides: 93 mg/dL (ref 0.0–149.0)
VLDL: 18.6 mg/dL (ref 0.0–40.0)

## 2024-01-22 LAB — COMPREHENSIVE METABOLIC PANEL WITH GFR
ALT: 19 U/L (ref 0–35)
AST: 25 U/L (ref 0–37)
Albumin: 4.6 g/dL (ref 3.5–5.2)
Alkaline Phosphatase: 85 U/L (ref 39–117)
BUN: 19 mg/dL (ref 6–23)
CO2: 30 meq/L (ref 19–32)
Calcium: 9.3 mg/dL (ref 8.4–10.5)
Chloride: 102 meq/L (ref 96–112)
Creatinine, Ser: 0.94 mg/dL (ref 0.40–1.20)
GFR: 57.24 mL/min — ABNORMAL LOW (ref >60.00)
Glucose, Bld: 97 mg/dL (ref 70–99)
Potassium: 4.5 meq/L (ref 3.5–5.1)
Sodium: 141 meq/L (ref 135–145)
Total Bilirubin: 1.1 mg/dL (ref 0.2–1.2)
Total Protein: 6.6 g/dL (ref 6.0–8.3)

## 2024-01-22 LAB — CBC WITH DIFFERENTIAL/PLATELET
Basophils Absolute: 0 10*3/uL (ref 0.0–0.1)
Basophils Relative: 0.7 % (ref 0.0–3.0)
Eosinophils Absolute: 0.1 10*3/uL (ref 0.0–0.7)
Eosinophils Relative: 1.2 % (ref 0.0–5.0)
HCT: 42.9 % (ref 36.0–46.0)
Hemoglobin: 14.4 g/dL (ref 12.0–15.0)
Lymphocytes Relative: 14.5 % (ref 12.0–46.0)
Lymphs Abs: 0.8 10*3/uL (ref 0.7–4.0)
MCHC: 33.5 g/dL (ref 30.0–36.0)
MCV: 96.8 fl (ref 78.0–100.0)
Monocytes Absolute: 0.4 10*3/uL (ref 0.1–1.0)
Monocytes Relative: 8.1 % (ref 3.0–12.0)
Neutro Abs: 4.2 10*3/uL (ref 1.4–7.7)
Neutrophils Relative %: 75.5 % (ref 43.0–77.0)
Platelets: 195 10*3/uL (ref 150.0–400.0)
RBC: 4.43 Mil/uL (ref 3.87–5.11)
RDW: 14.3 % (ref 11.5–15.5)
WBC: 5.5 10*3/uL (ref 4.0–10.5)

## 2024-01-22 LAB — TSH: TSH: 2.92 u[IU]/mL (ref 0.35–5.50)

## 2024-01-22 LAB — VITAMIN D 25 HYDROXY (VIT D DEFICIENCY, FRACTURES): VITD: 49.67 ng/mL (ref 30.00–100.00)

## 2024-01-22 MED ORDER — OMEPRAZOLE 20 MG PO CPDR: 20.0000 mg | DELAYED_RELEASE_CAPSULE | Freq: Every day | ORAL | 3 refills | Status: AC

## 2024-01-22 NOTE — Assessment & Plan Note (Signed)
 Chronic osteoarthritis is present with a history of meniscectomy on the left knee and gel injections in both knees. She is not using pain medication currently. Continue current management with orthopedic follow-up as needed.

## 2024-01-22 NOTE — Progress Notes (Signed)
 Bethanie Dicker, NP-C Phone: [phone]  Cynthia Potts is a 83 y.o. female who presents today to establish care.   Discussed the use of AI scribe software for clinical note transcription with the patient, who gave verbal consent to proceed.  History of Present Illness   Cynthia Potts is an 83 year old female who presents to establish care.  She has a history of atrial fibrillation managed with Eliquis and metoprolol. She takes metoprolol three times a day, with two doses at night and one in the morning. No new palpitations or bleeding issues with Eliquis, although she bruises easily. No dizziness, chest pain, or shortness of breath.  She has arthritis in her knees and regularly sees an orthopedic specialist. She has received injections in both knees, including a meniscectomy on the left knee in 2017 and gel injections. She is not currently taking any pain medication for her knees.  She has a history of psoriasis and previously had severe psoriatic arthritis, for which she was treated with Enbrel. She discontinued Enbrel and has been doing well since then.  She experiences acid reflux and takes over-the-counter Prilosec daily. She describes feeling 'stuffed' and 'miserable' after eating small amounts of food if she does not take Prilosec. No trouble swallowing, blood in stool, or abdominal pain.  Her medication list includes vitamin D, magnesium, a multivitamin, and over-the-counter Prilosec for acid reflux. She does not recall the dosage of Prilosec but takes it once a day.      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   Chest pain 09/12/2015   Moderate mitral insufficiency 02/11/2015   Awareness of heartbeats 02/24/2014   Paroxysmal atrial fibrillation (HCC) 01/22/2024   Vitamin D deficiency 01/22/2024    Osteoarthritis of both knees 01/22/2024   Chronic pain of both knees 01/22/2024   Gastroesophageal reflux disease 01/22/2024   Hx of psoriatic arthritis 01/22/2024   Resolved Ambulatory Problems    Diagnosis Date Noted   Pulmonary hypertension (HCC) 06/03/2015   Chronic diastolic CHF (congestive heart failure) (HCC) 06/14/2015   Past Medical History:  Diagnosis Date   Arthritis    Biatrial enlargement    GERD (gastroesophageal reflux disease)    HOH (hard of hearing)    Moderate mitral regurgitation    Moderate tricuspid regurgitation    Psoriasis    Psoriatic arthritis (HCC)    RLS (restless legs syndrome)     Family History  Problem Relation Age of Onset   Diabetes Mother    Stroke Father 14    Social History   Socioeconomic History   Marital status: Married    Spouse name: Not on file   Number of children: Not on file   Years of education: Not on file   Highest education level: Not on file  Occupational History   Not on file  Tobacco Use   Smoking status: Former    Current packs/day: 0.00    Average packs/day: 1 pack/day for 30.0 years (30.0 ttl pk-yrs)    Types: Cigarettes    Start date: 07/08/1953    Quit date: 07/09/1983    Years since quitting: 40.5   Smokeless tobacco: Never  Vaping Use   Vaping status: Never Used  Substance and Sexual Activity   Alcohol use: Yes    Alcohol/week: 1.0 standard drink of alcohol    Types: 1 Glasses of wine  per week    Comment: daily   Drug use: No   Sexual activity: Not on file  Other Topics Concern   Not on file  Social History Narrative   ** Merged History Encounter **       Lives in [place].  Retired Engineer, civil (consulting).  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   Social Drivers of Corporate investment banker Strain: Not on file  Food Insecurity: Not on file  Transportation Needs: Not on file  Physical Activity: Not on file  Stress: Not on file  Social Connections: Not on file  Intimate Partner  Violence: Not on file    ROS  General:  Negative for unexplained weight loss, fever Skin: Negative for new or changing mole, sore that won't heal HEENT: Negative for trouble hearing, trouble seeing, ringing in ears, mouth sores, hoarseness, change in voice, dysphagia. CV:  Negative for chest pain, dyspnea, edema, palpitations Resp: Negative for cough, dyspnea, hemoptysis GI: Negative for nausea, vomiting, diarrhea, constipation, abdominal pain, melena, hematochezia. GU: Negative for dysuria, incontinence, urinary hesitance, hematuria, vaginal or penile discharge, polyuria, sexual difficulty, lumps in testicle or breasts MSK: Negative for muscle cramps or aches, joint pain or swelling Neuro: Negative for headaches, weakness, numbness, dizziness, passing out/fainting Psych: Negative for depression, anxiety, memory problems  Objective  Physical Exam Vitals:   01/22/24 0957  BP: 96/60  Pulse: 75  Temp: 97.6 F (36.4 C)  SpO2: 98%    BP Readings from Last 3 Encounters:  01/22/24 96/60  04/03/23 125/77  05/08/17 128/67   Wt Readings from Last 3 Encounters:  01/22/24 135 lb 12.8 oz (61.6 kg)  05/08/17 130 lb (59 kg)  04/22/17 130 lb (59 kg)    Physical Exam Constitutional:      General: She is not in acute distress.    Appearance: Normal appearance.  HENT:     Head: Normocephalic.  Cardiovascular:     Rate and Rhythm: Normal rate. Rhythm irregularly irregular.     Heart sounds: Normal heart sounds.  Pulmonary:     Effort: Pulmonary effort is normal.     Breath sounds: Normal breath sounds.  Skin:    General: Skin is warm and dry.  Neurological:     General: No focal deficit present.     Mental Status: She is alert.  Psychiatric:        Mood and Affect: Mood normal.        Behavior: Behavior normal.     Assessment/Plan:   Paroxysmal atrial fibrillation (HCC) Assessment & Plan: Chronic atrial fibrillation is effectively managed by cardiology with metoprolol  for rate control. Bruising is noted and expected with Eliquis. Continue Eliquis 5 mg twice daily and metoprolol 25 mg three times daily. Monitor for new palpitations or bleeding issues. Follow up with cardiology in October. Lab work as outlined.   Orders: -     CBC with Differential/Platelet -     Comprehensive metabolic panel with GFR  Osteoarthritis of both knees, unspecified osteoarthritis type Assessment & Plan: Chronic osteoarthritis is present with a history of meniscectomy on the left knee and gel injections in both knees. She is not using pain medication currently. Continue current management with orthopedic follow-up as needed.   Gastroesophageal reflux disease, unspecified whether esophagitis present Assessment & Plan: Chronic GERD is well-controlled with daily Prilosec. She reports no dysphagia, hematochezia, or abdominal pain. Continue Prilosec 20 mg daily. We will send prescription to pharmacy. Advised to monitor  diet.   Orders: -     Omeprazole; Take 1 capsule (20 mg total) by mouth daily.  Dispense: 90 capsule; Refill: 3  Hx of psoriatic arthritis Assessment & Plan: Severe psoriatic arthritis is in remission without medication and she has no current symptoms. Monitor for any recurrence of symptoms.   Vitamin D deficiency -     VITAMIN D 25 Hydroxy (Vit-D Deficiency, Fractures)  Lipid screening -     Lipid panel  Thyroid disorder screen -     TSH    Return in about 1 year (around 01/21/2025) for Follow up, sooner as needed.   Bluford Burkitt, NP-C [facility]

## 2024-01-22 NOTE — Assessment & Plan Note (Signed)
 Chronic GERD is well-controlled with daily Prilosec. She reports no dysphagia, hematochezia, or abdominal pain. Continue Prilosec 20 mg daily. We will send prescription to pharmacy. Advised to monitor diet.

## 2024-01-22 NOTE — Assessment & Plan Note (Signed)
 Severe psoriatic arthritis is in remission without medication and she has no current symptoms. Monitor for any recurrence of symptoms.

## 2024-01-22 NOTE — Assessment & Plan Note (Addendum)
 Chronic atrial fibrillation is effectively managed by cardiology with metoprolol for rate control. Bruising is noted and expected with Eliquis. Continue Eliquis 5 mg twice daily and metoprolol 25 mg three times daily. Monitor for new palpitations or bleeding issues. Follow up with cardiology in October. Lab work as outlined.

## 2024-01-23 ENCOUNTER — Encounter: Payer: Self-pay | Admitting: Nurse Practitioner

## 2024-02-24 DIAGNOSIS — Z961 Presence of intraocular lens: Secondary | ICD-10-CM | POA: Diagnosis not present

## 2024-02-24 DIAGNOSIS — H0011 Chalazion right upper eyelid: Secondary | ICD-10-CM | POA: Diagnosis not present

## 2024-02-26 DIAGNOSIS — H43813 Vitreous degeneration, bilateral: Secondary | ICD-10-CM | POA: Diagnosis not present

## 2024-02-26 DIAGNOSIS — Z961 Presence of intraocular lens: Secondary | ICD-10-CM | POA: Diagnosis not present

## 2024-02-28 DIAGNOSIS — G8929 Other chronic pain: Secondary | ICD-10-CM | POA: Diagnosis not present

## 2024-02-28 DIAGNOSIS — M25562 Pain in left knee: Secondary | ICD-10-CM | POA: Diagnosis not present

## 2024-03-24 DIAGNOSIS — M1712 Unilateral primary osteoarthritis, left knee: Secondary | ICD-10-CM | POA: Diagnosis not present

## 2024-04-22 ENCOUNTER — Ambulatory Visit

## 2024-06-26 DIAGNOSIS — M7052 Other bursitis of knee, left knee: Secondary | ICD-10-CM | POA: Diagnosis not present

## 2024-06-26 DIAGNOSIS — G8929 Other chronic pain: Secondary | ICD-10-CM | POA: Diagnosis not present

## 2024-06-26 DIAGNOSIS — M1712 Unilateral primary osteoarthritis, left knee: Secondary | ICD-10-CM | POA: Diagnosis not present

## 2024-06-30 NOTE — Therapy (Signed)
 OUTPATIENT PHYSICAL THERAPY LOWER EXTREMITY EVALUATION   Patient Name: Cynthia Potts MRN: [id number] DOB:01-Feb-1941, 83 y.o., female Today's Date: 07/02/2024  END OF SESSION:  PT End of Session - 07/01/24 1646     Visit Number 1    Number of Visits 17    Date for Recertification  08/26/24    PT Start Time 1645    PT Stop Time 1725    PT Time Calculation (min) 40 min    Activity Tolerance Patient tolerated treatment well    Behavior During Therapy WFL for tasks assessed/performed          Past Medical History:  Diagnosis Date   Arthritis    Biatrial enlargement    GERD (gastroesophageal reflux disease)    HOH (hard of hearing)    Bilateral Hearing Aids   Moderate mitral regurgitation    Moderate tricuspid regurgitation    Persistent atrial fibrillation (HCC)    Psoriasis    Psoriatic arthritis (HCC)    RLS (restless legs syndrome)    Past Surgical History:  Procedure Laterality Date   CARDIAC CATHETERIZATION N/A 06/06/2015   Procedure: Right Heart Cath;  Surgeon: Ezra GORMAN Shuck, MD;  Location: [facility] INVASIVE CV LAB;  Service: Cardiovascular;  Laterality: N/A;   CATARACT EXTRACTION W/PHACO Left 08/23/2015   Procedure: CATARACT EXTRACTION PHACO AND INTRAOCULAR LENS PLACEMENT (IOC);  Surgeon: Elsie Carmine, MD;  Location: [facility] ORS;  Service: Ophthalmology;  Laterality: Left;  US  01:07 AP% 22.9 CDE 15.42 fluid pack lot #[id number]   CATARACT EXTRACTION W/PHACO Right 11/22/2015   Procedure: CATARACT EXTRACTION PHACO AND INTRAOCULAR LENS PLACEMENT (IOC);  Surgeon: Elsie Carmine, MD;  Location: [facility] ORS;  Service: Ophthalmology;  Laterality: Right;  US   0:58.2 AP%  15.2 CDE   8.98 fluid pack lot # [id number]   CYSTOSCOPY     ELECTROPHYSIOLOGIC STUDY N/A 02/28/2015   Procedure: Cardioversion;  Surgeon: Wolm JINNY Rhyme, MD;  Location: [facility] ORS;  Service: Cardiovascular;  Laterality: N/A;   ELECTROPHYSIOLOGIC STUDY N/A 03/24/2015   Procedure: Cardioversion;  Surgeon: Wolm JINNY Rhyme, MD;  Location: [facility] ORS;  Service: Cardiovascular;  Laterality: N/A;   EYE SURGERY     KNEE ARTHROSCOPY Left 05/08/2017   Procedure: ARTHROSCOPY KNEE, Tear of the posterior horn of medical meniscus, Partial medical meniscectomy;  Surgeon: Mardee Lynwood SQUIBB, MD;  Location: [facility] ORS;  Service: Orthopedics;  Laterality: Left;   none     TEE WITHOUT CARDIOVERSION N/A 05/12/2015   Procedure: TRANSESOPHAGEAL ECHOCARDIOGRAM (TEE);  Surgeon: Maude JAYSON Emmer, MD;  Location: [facility] ENDOSCOPY;  Service: Cardiovascular;  Laterality: N/A;   TUBAL LIGATION     Patient Active Problem List   Diagnosis Date Noted   Paroxysmal atrial fibrillation (HCC) 01/22/2024   Vitamin D  deficiency 01/22/2024   Osteoarthritis of both knees 01/22/2024   Chronic pain of both knees 01/22/2024   Gastroesophageal reflux disease 01/22/2024   Hx of psoriatic arthritis 01/22/2024   Chest pain 09/12/2015   Moderate tricuspid insufficiency 06/03/2015   Persistent atrial fibrillation (HCC) 04/06/2015   Atrial flutter with controlled response (HCC) 03/24/2015   Moderate mitral insufficiency 02/11/2015   Awareness of heartbeats 02/24/2014    PCP: Gretel App, NP  REFERRING PROVIDER: Sharrie Barter, MD  REFERRING DIAG:  [phone] (ICD-10-CM) - Pain in left knee G89.29 (ICD-10-CM) - Other chronic pain M17.12 (ICD-10-CM) - Unilateral primary osteoarthritis, left knee M70.52 (ICD-10-CM) - Other bursitis of knee, left knee  THERAPY DIAG:  Muscle weakness (generalized)  Chronic pain of  left knee  Rationale for Evaluation and Treatment: Rehabilitation  ONSET DATE: chronic  SUBJECTIVE:   SUBJECTIVE STATEMENT: Patient reports she has arthritis in the L knee and got an injection last week in her L knee and was told she had bursitis. Patient reports she is active and walks a couple miles/day but no other strength training. Most of her complaints are in the medial aspect of her L knee.   PERTINENT HISTORY: Per MD note  on 06/26/2024, patient reports their symptoms are still intermittently occurring around her medial joint line. She currently rates pain severity as a 1/10. She feels her pain more with walking and stairs. She has increased pain after prolonged sitting and start up pain. She reports associated stiffness. She denies associated swelling, locking/catching, instability, numbness or tingling, weakness, fevers or chills, night sweats, loss, skin color change, pain at night. She has been using topical diclofenac/pain cream, acetaminophen  in addition to recent steroid injection, viscosupplementation with ongoing symptoms. Her last landmark guided steroid injection was on 11/15/2023 at [facility] which only provided 1 day of symptom relief.  PAIN:  Are you having pain? Yes: NPRS scale: 1-2/10; 4-5/10 at worst  Pain location: medial aspect of L knee Pain description: achy  Aggravating factors: prolonged sitting,  Relieving factors: resting  PRECAUTIONS: None  RED FLAGS: None   WEIGHT BEARING RESTRICTIONS: No  FALLS:  Has patient fallen in last 6 months? No  LIVING ENVIRONMENT: Lives with: lives with their spouse Lives in: House/apartment Stairs: 1 STE from garage; flight to basement that she accesses every day   OCCUPATION: Retired [employer] fundraiser  PLOF: Independent  PATIENT GOALS: for my knee to be more flexible and pain free    OBJECTIVE:  Note: Objective measures were completed at Evaluation unless otherwise noted.  DIAGNOSTIC FINDINGS:   EXAM: Left knee MRI without contrast performed 11/21/2016 at [facility]  IMPRESSION:  1. Irregular radial tear posterior horn medial meniscus.  2. 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  3. Potential sprain of the MCL with edema tracking superficial to the slightly thickened MCL.  4. Moderate knee effusion with superior plica.  5. There are 2 small chondral fragments posteriorly in the knee joint.  6. Very small Baker's cyst with adjacent  distal semimembranosus tendinopathy.  7. Linear irregularity in the distal lateral quadriceps tendon may be residua from an old injury.   PATIENT SURVEYS:  LEFS  Extreme difficulty/unable (0), Quite a bit of difficulty (1), Moderate difficulty (2), Little difficulty (3), No difficulty (4) Survey date:    Any of your usual work, housework or school activities 4  2. Usual hobbies, recreational or sporting activities 4  3. Getting into/out of the bath 4  4. Walking between rooms 4  5. Putting on socks/shoes 4  6. Squatting  1  7. Lifting an object, like a bag of groceries from the floor 4  8. Performing light activities around your home 4  9. Performing heavy activities around your home 3  10. Getting into/out of a car 3  11. Walking 2 blocks 4  12. Walking 1 mile 4  13. Going up/down 10 stairs (1 flight) 4  14. Standing for 1 hour 3  15.  sitting for 1 hour 4  16. Running on even ground 1  17. Running on uneven ground 1  18. Making sharp turns while running fast 1  19. Hopping  1  20. Rolling over in bed 4  Score total:  62/80 = 77.5%  COGNITION: Overall cognitive status: Within functional limits for tasks assessed     SENSATION: WFL  POSTURE: rounded shoulders and forward head  PALPATION: Mild tenderness to medial joint line of L knee   LOWER EXTREMITY ROM: WFL  LOWER EXTREMITY MMT:  MMT Right eval Left eval  Hip flexion 4 4  Hip extension    Hip abduction 4+ 4+  Hip adduction 4+ 4+  Hip internal rotation    Hip external rotation    Knee flexion 4 4  Knee extension 4 4  Ankle dorsiflexion 4+ 4+  Ankle plantarflexion    Ankle inversion    Ankle eversion     (Blank rows = not tested)  LOWER EXTREMITY SPECIAL TESTS:  Knee special tests: McMurray's test: negative and Thessaly test: negative  FUNCTIONAL TESTS:  5 times sit to stand: 11.68 seconds 6 minute walk test: to be completed at visit #2 10 meter walk test: to be completed at visit  #2  GAIT: Distance walked: 38' Assistive device utilized: None Level of assistance: Complete Independence Comments: mild trendelenburg bilaterally                                                                                                                                 TREATMENT DATE: 07/01/24    PATIENT EDUCATION:  Education details: HEP, POC, goals  Person educated: Patient Education method: Explanation, Demonstration, and Handouts Education comprehension: verbalized understanding and returned demonstration  HOME EXERCISE PROGRAM: Access Code: [email or web address] URL: [email or web address] Date: 07/01/2024 Prepared by: Maryanne Finder  Exercises - Sit to Stand with Arms Crossed  - 2-3 x daily - 5-7 x weekly - 3 sets - 10 reps - Standing March with Counter Support  - 2-3 x daily - 5-7 x weekly - 3 sets - 10 reps - Standing Hip Abduction with Counter Support  - 2-3 x daily - 5-7 x weekly - 3 sets - 10 reps - Standing Hip Extension with Counter Support  - 2-3 x daily - 5-7 x weekly - 3 sets - 10 reps - Heel Toe Raises with Counter Support  - 2-3 x daily - 5-7 x weekly - 3 sets - 10 reps  ASSESSMENT:  CLINICAL IMPRESSION: Patient is a 83 y.o. female who was seen today for physical therapy evaluation and treatment for L knee pain. Patient demonstrates decreased functional BLE strength and decreased activity tolerance. Patient does remain active with frequent walking each day, however hip weakness is evident with mild Trendelenburg bilaterally. HEP initiated with focus on hip strengthening with good return demonstration. Patient will benefit from skilled PT services to address listed impairments to improve quality of life and reduce pain.   OBJECTIVE IMPAIRMENTS: Abnormal gait, decreased balance, difficulty walking, decreased strength, postural dysfunction, and pain.   ACTIVITY LIMITATIONS: squatting and stairs  PARTICIPATION LIMITATIONS: community activity and yard  work  PERSONAL FACTORS: Age, Fitness, and Time since onset of injury/illness/exacerbation are also affecting patient's  functional outcome.   REHAB POTENTIAL: Good  CLINICAL DECISION MAKING: Stable/uncomplicated  EVALUATION COMPLEXITY: Low   GOALS: Goals reviewed with patient? Yes  SHORT TERM GOALS: Target date: 07/28/2024  Patient will be independent in HEP to improve strength/mobility for better functional independence with ADLs.  Baseline: 07/01/24: HEP initiated  Goal status: INITIAL   LONG TERM GOALS: Target date: 08/26/2024  Patient will increase lower extremity functional scale by 9 points to demonstrate improved functional mobility and increased tolerance with ADLs.  Baseline: 07/01/24: 62/80 = 77.5% Goal status: INITIAL  2.  Patient will increase BLE gross strength 1/3 MMT grade as to improve functional strength for independent gait, increased standing tolerance and increased ADL ability.  Baseline: 07/01/24: see above  Goal status: INITIAL  3.  Patient (> 43 years old) will improve five times sit to stand test by 3 seconds indicating an increased LE strength and improved balance.  Baseline: 07/01/24: 11.68 seconds  Goal status: INITIAL  4.  Patient will improve by 65m (164') in order to demonstrate clinically significant improvement in cardiopulmonary endurance and community ambulation  Baseline: 07/01/24: to be completed visit #2 Goal status: INITIAL  5.  Patient will ascend/descend 4 stairs with reciprocal pattern and without rail assist independently without loss of balance to improve ability to get in/out of home.  Baseline: 07/01/24: to be assessed visit #2  Goal status: INITIAL   PLAN:  PT FREQUENCY: 1-2x/week  PT DURATION: 8 weeks  PLANNED INTERVENTIONS: 97164- PT Re-evaluation, 97750- Physical Performance Testing, 97110-Therapeutic exercises, 97530- Therapeutic activity, 97112- Neuromuscular re-education, [phone]- Self Care, [id number]- Manual therapy,  [phone]- Gait training, [phone]- Electrical stimulation (unattended), [phone]- Electrical stimulation (manual), Patient/Family education, Balance training, Stair training, Joint mobilization, Joint manipulation, DME instructions, Cryotherapy, and Moist heat  PLAN FOR NEXT SESSION: assess stair negotiation, , BLE strengthening (functional activities included)   Maryanne Finder, PT, DPT Physical Therapist - [facility] 07/02/2024, 12:08 PM

## 2024-07-01 ENCOUNTER — Ambulatory Visit: Attending: Sports Medicine

## 2024-07-01 DIAGNOSIS — G8929 Other chronic pain: Secondary | ICD-10-CM | POA: Diagnosis not present

## 2024-07-01 DIAGNOSIS — M25562 Pain in left knee: Secondary | ICD-10-CM | POA: Diagnosis not present

## 2024-07-01 DIAGNOSIS — M6281 Muscle weakness (generalized): Secondary | ICD-10-CM | POA: Diagnosis not present

## 2024-07-07 ENCOUNTER — Ambulatory Visit

## 2024-07-08 NOTE — Therapy (Signed)
 OUTPATIENT PHYSICAL THERAPY LOWER EXTREMITY TREATMENT   Patient Name: Cynthia Potts MRN: [id number] DOB:22-Feb-1941, 83 y.o., female Today's Date: 07/09/2024  END OF SESSION:  PT End of Session - 07/09/24 0859     Visit Number 2    Number of Visits 17    Date for Recertification  08/26/24    PT Start Time 0900    PT Stop Time 0942    PT Time Calculation (min) 42 min    Activity Tolerance Patient tolerated treatment well    Behavior During Therapy WFL for tasks assessed/performed           Past Medical History:  Diagnosis Date   Arthritis    Biatrial enlargement    GERD (gastroesophageal reflux disease)    HOH (hard of hearing)    Bilateral Hearing Aids   Moderate mitral regurgitation    Moderate tricuspid regurgitation    Persistent atrial fibrillation (HCC)    Psoriasis    Psoriatic arthritis (HCC)    RLS (restless legs syndrome)    Past Surgical History:  Procedure Laterality Date   CARDIAC CATHETERIZATION N/A 06/06/2015   Procedure: Right Heart Cath;  Surgeon: Ezra GORMAN Shuck, MD;  Location: [facility] INVASIVE CV LAB;  Service: Cardiovascular;  Laterality: N/A;   CATARACT EXTRACTION W/PHACO Left 08/23/2015   Procedure: CATARACT EXTRACTION PHACO AND INTRAOCULAR LENS PLACEMENT (IOC);  Surgeon: Elsie Carmine, MD;  Location: [facility] ORS;  Service: Ophthalmology;  Laterality: Left;  US  01:07 AP% 22.9 CDE 15.42 fluid pack lot #[id number]   CATARACT EXTRACTION W/PHACO Right 11/22/2015   Procedure: CATARACT EXTRACTION PHACO AND INTRAOCULAR LENS PLACEMENT (IOC);  Surgeon: Elsie Carmine, MD;  Location: [facility] ORS;  Service: Ophthalmology;  Laterality: Right;  US   0:58.2 AP%  15.2 CDE   8.98 fluid pack lot # [id number]   CYSTOSCOPY     ELECTROPHYSIOLOGIC STUDY N/A 02/28/2015   Procedure: Cardioversion;  Surgeon: Wolm JINNY Rhyme, MD;  Location: [facility] ORS;  Service: Cardiovascular;  Laterality: N/A;   ELECTROPHYSIOLOGIC STUDY N/A 03/24/2015   Procedure: Cardioversion;  Surgeon:  Wolm JINNY Rhyme, MD;  Location: [facility] ORS;  Service: Cardiovascular;  Laterality: N/A;   EYE SURGERY     KNEE ARTHROSCOPY Left 05/08/2017   Procedure: ARTHROSCOPY KNEE, Tear of the posterior horn of medical meniscus, Partial medical meniscectomy;  Surgeon: Mardee Lynwood SQUIBB, MD;  Location: [facility] ORS;  Service: Orthopedics;  Laterality: Left;   none     TEE WITHOUT CARDIOVERSION N/A 05/12/2015   Procedure: TRANSESOPHAGEAL ECHOCARDIOGRAM (TEE);  Surgeon: Maude JAYSON Emmer, MD;  Location: [facility] ENDOSCOPY;  Service: Cardiovascular;  Laterality: N/A;   TUBAL LIGATION     Patient Active Problem List   Diagnosis Date Noted   Paroxysmal atrial fibrillation (HCC) 01/22/2024   Vitamin D  deficiency 01/22/2024   Osteoarthritis of both knees 01/22/2024   Chronic pain of both knees 01/22/2024   Gastroesophageal reflux disease 01/22/2024   Hx of psoriatic arthritis 01/22/2024   Chest pain 09/12/2015   Moderate tricuspid insufficiency 06/03/2015   Persistent atrial fibrillation (HCC) 04/06/2015   Atrial flutter with controlled response (HCC) 03/24/2015   Moderate mitral insufficiency 02/11/2015   Awareness of heartbeats 02/24/2014    PCP: Gretel App, NP  REFERRING PROVIDER: Sharrie Barter, MD  REFERRING DIAG:  [phone] (ICD-10-CM) - Pain in left knee G89.29 (ICD-10-CM) - Other chronic pain M17.12 (ICD-10-CM) - Unilateral primary osteoarthritis, left knee M70.52 (ICD-10-CM) - Other bursitis of knee, left knee  THERAPY DIAG:  Muscle weakness (generalized)  Chronic pain  of left knee  Rationale for Evaluation and Treatment: Rehabilitation  ONSET DATE: chronic  SUBJECTIVE:   SUBJECTIVE STATEMENT: Patient reports she has arthritis in the L knee and got an injection last week in her L knee and was told she had bursitis. Patient reports she is active and walks a couple miles/day but no other strength training. Most of her complaints are in the medial aspect of her L knee.   PERTINENT HISTORY: Per MD  note on 06/26/2024, patient reports their symptoms are still intermittently occurring around her medial joint line. She currently rates pain severity as a 1/10. She feels her pain more with walking and stairs. She has increased pain after prolonged sitting and start up pain. She reports associated stiffness. She denies associated swelling, locking/catching, instability, numbness or tingling, weakness, fevers or chills, night sweats, loss, skin color change, pain at night. She has been using topical diclofenac/pain cream, acetaminophen  in addition to recent steroid injection, viscosupplementation with ongoing symptoms. Her last landmark guided steroid injection was on 11/15/2023 at [facility] which only provided 1 day of symptom relief.  PAIN:  Are you having pain? Yes: NPRS scale: 1-2/10; 4-5/10 at worst  Pain location: medial aspect of L knee Pain description: achy  Aggravating factors: prolonged sitting,  Relieving factors: resting  PRECAUTIONS: None  RED FLAGS: None   WEIGHT BEARING RESTRICTIONS: No  FALLS:  Has patient fallen in last 6 months? No  LIVING ENVIRONMENT: Lives with: lives with their spouse Lives in: House/apartment Stairs: 1 STE from garage; flight to basement that she accesses every day   OCCUPATION: Retired [employer] fundraiser  PLOF: Independent  PATIENT GOALS: for my knee to be more flexible and pain free    OBJECTIVE:  Note: Objective measures were completed at Evaluation unless otherwise noted.  DIAGNOSTIC FINDINGS:   EXAM: Left knee MRI without contrast performed 11/21/2016 at [facility]  IMPRESSION:  1. Irregular radial tear posterior horn medial meniscus.  2. 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  3. Potential sprain of the MCL with edema tracking superficial to the slightly thickened MCL.  4. Moderate knee effusion with superior plica.  5. There are 2 small chondral fragments posteriorly in the knee joint.  6. Very small Baker's cyst with adjacent  distal semimembranosus tendinopathy.  7. Linear irregularity in the distal lateral quadriceps tendon may be residua from an old injury.   PATIENT SURVEYS:  LEFS  Extreme difficulty/unable (0), Quite a bit of difficulty (1), Moderate difficulty (2), Little difficulty (3), No difficulty (4) Survey date:    Any of your usual work, housework or school activities 4  2. Usual hobbies, recreational or sporting activities 4  3. Getting into/out of the bath 4  4. Walking between rooms 4  5. Putting on socks/shoes 4  6. Squatting  1  7. Lifting an object, like a bag of groceries from the floor 4  8. Performing light activities around your home 4  9. Performing heavy activities around your home 3  10. Getting into/out of a car 3  11. Walking 2 blocks 4  12. Walking 1 mile 4  13. Going up/down 10 stairs (1 flight) 4  14. Standing for 1 hour 3  15.  sitting for 1 hour 4  16. Running on even ground 1  17. Running on uneven ground 1  18. Making sharp turns while running fast 1  19. Hopping  1  20. Rolling over in bed 4  Score total:  62/80 = 77.5%  COGNITION: Overall cognitive status: Within functional limits for tasks assessed     SENSATION: WFL  POSTURE: rounded shoulders and forward head  PALPATION: Mild tenderness to medial joint line of L knee   LOWER EXTREMITY ROM: WFL  LOWER EXTREMITY MMT:  MMT Right eval Left eval  Hip flexion 4 4  Hip extension    Hip abduction 4+ 4+  Hip adduction 4+ 4+  Hip internal rotation    Hip external rotation    Knee flexion 4 4  Knee extension 4 4  Ankle dorsiflexion 4+ 4+  Ankle plantarflexion    Ankle inversion    Ankle eversion     (Blank rows = not tested)  LOWER EXTREMITY SPECIAL TESTS:  Knee special tests: McMurray's test: negative and Thessaly test: negative  FUNCTIONAL TESTS:  5 times sit to stand: 11.68 seconds 6 minute walk test: to be completed at visit #2 10 meter walk test: to be completed at visit  #2  GAIT: Distance walked: 25' Assistive device utilized: None Level of assistance: Complete Independence Comments: mild trendelenburg bilaterally                                                                                                                                 TREATMENT DATE: 07/09/24    Subjective: Patient denies pain on arrival. She did have pain yesterday during her 2 mile walk. Unsure of what caused it.   : 28' with no AD  Stair negotiation: reciprocal pattern on ascent with light B handrails, descend step sideways with railing   Therapeutic Activity:  Sit to stand from low mat table with 3kg med ball x 10 - easy  Standing marching with RTB around ankles 3 x 10  Standing hip abduction with RTB around ankles 3 x 10  Standing hip extension with RTB around ankles 3 x 10   Mini squats with B UE support 3 x 10   Sidestepping 4 x 15' while maintaining mini squat   PATIENT EDUCATION:  Education details: HEP, POC, goals  Person educated: Patient Education method: Explanation, Demonstration, and Handouts Education comprehension: verbalized understanding and returned demonstration  HOME EXERCISE PROGRAM: Access Code: [email or web address] URL: [email or web address] Date: 07/01/2024 Prepared by: Maryanne Finder  Exercises - Sit to Stand with Arms Crossed  - 2-3 x daily - 5-7 x weekly - 3 sets - 10 reps - Standing March with Counter Support  - 2-3 x daily - 5-7 x weekly - 3 sets - 10 reps - Standing Hip Abduction with Counter Support  - 2-3 x daily - 5-7 x weekly - 3 sets - 10 reps - Standing Hip Extension with Counter Support  - 2-3 x daily - 5-7 x weekly - 3 sets - 10 reps - Heel Toe Raises with Counter Support  - 2-3 x daily - 5-7 x weekly - 3 sets - 10 reps  ASSESSMENT:  CLINICAL IMPRESSION:   Patient seen for initial  treatment after evaluation of L knee pain. Reviewed HEP and increased resistance by adding red theraband with good tolerance. Session  focused on BLE strengthening with increased resistance. Included mini squats to HEP instead of sit to stands as these are easy for patient. Patient will benefit from skilled PT services to address listed impairments to improve quality of life and reduce pain.   OBJECTIVE IMPAIRMENTS: Abnormal gait, decreased balance, difficulty walking, decreased strength, postural dysfunction, and pain.   ACTIVITY LIMITATIONS: squatting and stairs  PARTICIPATION LIMITATIONS: community activity and yard work  PERSONAL FACTORS: Age, Fitness, and Time since onset of injury/illness/exacerbation are also affecting patient's functional outcome.   REHAB POTENTIAL: Good  CLINICAL DECISION MAKING: Stable/uncomplicated  EVALUATION COMPLEXITY: Low   GOALS: Goals reviewed with patient? Yes  SHORT TERM GOALS: Target date: 07/28/2024  Patient will be independent in HEP to improve strength/mobility for better functional independence with ADLs.  Baseline: 07/01/24: HEP initiated  Goal status: INITIAL   LONG TERM GOALS: Target date: 08/26/2024  Patient will increase lower extremity functional scale by 9 points to demonstrate improved functional mobility and increased tolerance with ADLs.  Baseline: 07/01/24: 62/80 = 77.5% Goal status: INITIAL  2.  Patient will increase BLE gross strength 1/3 MMT grade as to improve functional strength for independent gait, increased standing tolerance and increased ADL ability.  Baseline: 07/01/24: see above  Goal status: INITIAL  3.  Patient (> 58 years old) will improve five times sit to stand test by 3 seconds indicating an increased LE strength and improved balance.  Baseline: 07/01/24: 11.68 seconds  Goal status: INITIAL  4.  Patient will improve by 65m (164') in order to demonstrate clinically significant improvement in cardiopulmonary endurance and community ambulation  Baseline: 07/09/2024: 1430' with no AD  Goal status: INITIAL  5.  Patient will ascend/descend 4  stairs with reciprocal pattern and without rail assist independently without loss of balance to improve ability to get in/out of home.  Baseline: 07/09/2024: reciprocal pattern on ascent with light B handrails, descend step sideways with railing  Goal status: INITIAL   PLAN:  PT FREQUENCY: 1-2x/week  PT DURATION: 8 weeks  PLANNED INTERVENTIONS: 97164- PT Re-evaluation, 97750- Physical Performance Testing, 97110-Therapeutic exercises, 97530- Therapeutic activity, 97112- Neuromuscular re-education, [phone]- Self Care, [id number]- Manual therapy, [phone]- Gait training, [phone]- Electrical stimulation (unattended), [phone]- Electrical stimulation (manual), Patient/Family education, Balance training, Stair training, Joint mobilization, Joint manipulation, DME instructions, Cryotherapy, and Moist heat  PLAN FOR NEXT SESSION: assess stair negotiation, , BLE strengthening (functional activities included)   Maryanne Finder, PT, DPT Physical Therapist - [place]  [place] 07/09/2024, 8:59 AM

## 2024-07-09 ENCOUNTER — Ambulatory Visit: Attending: Sports Medicine

## 2024-07-09 DIAGNOSIS — M6281 Muscle weakness (generalized): Secondary | ICD-10-CM | POA: Diagnosis not present

## 2024-07-09 DIAGNOSIS — G8929 Other chronic pain: Secondary | ICD-10-CM | POA: Insufficient documentation

## 2024-07-09 DIAGNOSIS — M25562 Pain in left knee: Secondary | ICD-10-CM | POA: Insufficient documentation

## 2024-07-13 DIAGNOSIS — I4811 Longstanding persistent atrial fibrillation: Secondary | ICD-10-CM | POA: Diagnosis not present

## 2024-07-13 DIAGNOSIS — I340 Nonrheumatic mitral (valve) insufficiency: Secondary | ICD-10-CM | POA: Diagnosis not present

## 2024-07-13 DIAGNOSIS — I071 Rheumatic tricuspid insufficiency: Secondary | ICD-10-CM | POA: Diagnosis not present

## 2024-07-15 NOTE — Therapy (Signed)
 OUTPATIENT PHYSICAL THERAPY LOWER EXTREMITY TREATMENT   Patient Name: Cynthia Potts MRN: [id number] DOB:06-23-1941, 83 y.o., female Today's Date: 07/16/2024  END OF SESSION:  PT End of Session - 07/16/24 0943     Visit Number 3    Number of Visits 17    Date for Recertification  08/26/24    PT Start Time 0945    PT Stop Time 1027    PT Time Calculation (min) 42 min    Activity Tolerance Patient tolerated treatment well    Behavior During Therapy WFL for tasks assessed/performed            Past Medical History:  Diagnosis Date   Arthritis    Biatrial enlargement    GERD (gastroesophageal reflux disease)    HOH (hard of hearing)    Bilateral Hearing Aids   Moderate mitral regurgitation    Moderate tricuspid regurgitation    Persistent atrial fibrillation (HCC)    Psoriasis    Psoriatic arthritis (HCC)    RLS (restless legs syndrome)    Past Surgical History:  Procedure Laterality Date   CARDIAC CATHETERIZATION N/A 06/06/2015   Procedure: Right Heart Cath;  Surgeon: Ezra GORMAN Shuck, MD;  Location: [facility] INVASIVE CV LAB;  Service: Cardiovascular;  Laterality: N/A;   CATARACT EXTRACTION W/PHACO Left 08/23/2015   Procedure: CATARACT EXTRACTION PHACO AND INTRAOCULAR LENS PLACEMENT (IOC);  Surgeon: Elsie Carmine, MD;  Location: [facility] ORS;  Service: Ophthalmology;  Laterality: Left;  US  01:07 AP% 22.9 CDE 15.42 fluid pack lot #[id number]   CATARACT EXTRACTION W/PHACO Right 11/22/2015   Procedure: CATARACT EXTRACTION PHACO AND INTRAOCULAR LENS PLACEMENT (IOC);  Surgeon: Elsie Carmine, MD;  Location: [facility] ORS;  Service: Ophthalmology;  Laterality: Right;  US   0:58.2 AP%  15.2 CDE   8.98 fluid pack lot # [id number]   CYSTOSCOPY     ELECTROPHYSIOLOGIC STUDY N/A 02/28/2015   Procedure: Cardioversion;  Surgeon: Wolm JINNY Rhyme, MD;  Location: [facility] ORS;  Service: Cardiovascular;  Laterality: N/A;   ELECTROPHYSIOLOGIC STUDY N/A 03/24/2015   Procedure: Cardioversion;  Surgeon:  Wolm JINNY Rhyme, MD;  Location: [facility] ORS;  Service: Cardiovascular;  Laterality: N/A;   EYE SURGERY     KNEE ARTHROSCOPY Left 05/08/2017   Procedure: ARTHROSCOPY KNEE, Tear of the posterior horn of medical meniscus, Partial medical meniscectomy;  Surgeon: Mardee Lynwood SQUIBB, MD;  Location: [facility] ORS;  Service: Orthopedics;  Laterality: Left;   none     TEE WITHOUT CARDIOVERSION N/A 05/12/2015   Procedure: TRANSESOPHAGEAL ECHOCARDIOGRAM (TEE);  Surgeon: Maude JAYSON Emmer, MD;  Location: [facility] ENDOSCOPY;  Service: Cardiovascular;  Laterality: N/A;   TUBAL LIGATION     Patient Active Problem List   Diagnosis Date Noted   Paroxysmal atrial fibrillation (HCC) 01/22/2024   Vitamin D  deficiency 01/22/2024   Osteoarthritis of both knees 01/22/2024   Chronic pain of both knees 01/22/2024   Gastroesophageal reflux disease 01/22/2024   Hx of psoriatic arthritis 01/22/2024   Chest pain 09/12/2015   Moderate tricuspid insufficiency 06/03/2015   Persistent atrial fibrillation (HCC) 04/06/2015   Atrial flutter with controlled response (HCC) 03/24/2015   Moderate mitral insufficiency 02/11/2015   Awareness of heartbeats 02/24/2014    PCP: Gretel App, NP  REFERRING PROVIDER: Sharrie Barter, MD  REFERRING DIAG:  [phone] (ICD-10-CM) - Pain in left knee G89.29 (ICD-10-CM) - Other chronic pain M17.12 (ICD-10-CM) - Unilateral primary osteoarthritis, left knee M70.52 (ICD-10-CM) - Other bursitis of knee, left knee  THERAPY DIAG:  Chronic pain of left knee  Muscle weakness (generalized)  Rationale for Evaluation and Treatment: Rehabilitation  ONSET DATE: chronic  SUBJECTIVE:   SUBJECTIVE STATEMENT: Patient reports she has arthritis in the L knee and got an injection last week in her L knee and was told she had bursitis. Patient reports she is active and walks a couple miles/day but no other strength training. Most of her complaints are in the medial aspect of her L knee.   PERTINENT HISTORY: Per MD  note on 06/26/2024, patient reports their symptoms are still intermittently occurring around her medial joint line. She currently rates pain severity as a 1/10. She feels her pain more with walking and stairs. She has increased pain after prolonged sitting and start up pain. She reports associated stiffness. She denies associated swelling, locking/catching, instability, numbness or tingling, weakness, fevers or chills, night sweats, loss, skin color change, pain at night. She has been using topical diclofenac/pain cream, acetaminophen  in addition to recent steroid injection, viscosupplementation with ongoing symptoms. Her last landmark guided steroid injection was on 11/15/2023 at [facility] which only provided 1 day of symptom relief.  PAIN:  Are you having pain? Yes: NPRS scale: 1-2/10; 4-5/10 at worst  Pain location: medial aspect of L knee Pain description: achy  Aggravating factors: prolonged sitting,  Relieving factors: resting  PRECAUTIONS: None  RED FLAGS: None   WEIGHT BEARING RESTRICTIONS: No  FALLS:  Has patient fallen in last 6 months? No  LIVING ENVIRONMENT: Lives with: lives with their spouse Lives in: House/apartment Stairs: 1 STE from garage; flight to basement that she accesses every day   OCCUPATION: Retired [employer] fundraiser  PLOF: Independent  PATIENT GOALS: for my knee to be more flexible and pain free    OBJECTIVE:  Note: Objective measures were completed at Evaluation unless otherwise noted.  DIAGNOSTIC FINDINGS:   EXAM: Left knee MRI without contrast performed 11/21/2016 at [facility]  IMPRESSION:  1. Irregular radial tear posterior horn medial meniscus.  2. 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  3. Potential sprain of the MCL with edema tracking superficial to the slightly thickened MCL.  4. Moderate knee effusion with superior plica.  5. There are 2 small chondral fragments posteriorly in the knee joint.  6. Very small Baker's cyst with adjacent  distal semimembranosus tendinopathy.  7. Linear irregularity in the distal lateral quadriceps tendon may be residua from an old injury.   PATIENT SURVEYS:  LEFS  Extreme difficulty/unable (0), Quite a bit of difficulty (1), Moderate difficulty (2), Little difficulty (3), No difficulty (4) Survey date:    Any of your usual work, housework or school activities 4  2. Usual hobbies, recreational or sporting activities 4  3. Getting into/out of the bath 4  4. Walking between rooms 4  5. Putting on socks/shoes 4  6. Squatting  1  7. Lifting an object, like a bag of groceries from the floor 4  8. Performing light activities around your home 4  9. Performing heavy activities around your home 3  10. Getting into/out of a car 3  11. Walking 2 blocks 4  12. Walking 1 mile 4  13. Going up/down 10 stairs (1 flight) 4  14. Standing for 1 hour 3  15.  sitting for 1 hour 4  16. Running on even ground 1  17. Running on uneven ground 1  18. Making sharp turns while running fast 1  19. Hopping  1  20. Rolling over in bed 4  Score total:  62/80 = 77.5%  COGNITION: Overall cognitive status: Within functional limits for tasks assessed     SENSATION: WFL  POSTURE: rounded shoulders and forward head  PALPATION: Mild tenderness to medial joint line of L knee   LOWER EXTREMITY ROM: WFL  LOWER EXTREMITY MMT:  MMT Right eval Left eval  Hip flexion 4 4  Hip extension    Hip abduction 4+ 4+  Hip adduction 4+ 4+  Hip internal rotation    Hip external rotation    Knee flexion 4 4  Knee extension 4 4  Ankle dorsiflexion 4+ 4+  Ankle plantarflexion    Ankle inversion    Ankle eversion     (Blank rows = not tested)  LOWER EXTREMITY SPECIAL TESTS:  Knee special tests: McMurray's test: negative and Thessaly test: negative  FUNCTIONAL TESTS:  5 times sit to stand: 11.68 seconds 6 minute walk test: to be completed at visit #2 10 meter walk test: to be completed at visit  #2  GAIT: Distance walked: 56' Assistive device utilized: None Level of assistance: Complete Independence Comments: mild trendelenburg bilaterally                                                                                                                                 TREATMENT DATE: 07/16/24     Subjective: Patient reports 3.5/10 pain in her L knee. Was going to walk this AM but felt like her knee was going to give out. Difficulty with exercises with theraband as it is causing pain in her knee and into thigh.   Therapeutic Activity:   Standing marching with 3# AW 3 x 10  Standing hip abduction with 3# AW  3 x 10  Standing hip extension with 3# AW 3 x 10   Mini squats with B UE support 3 x 10   Fwd step ups 6 step x 10 each LE - easy Fwd step downs 6 step 3 x 10 each LE   Kettlebell squat 10# KB 3 x 10   Sidestepping 4 x 30' while maintaining mini squat with GTB around knees  PATIENT EDUCATION:  Education details: HEP, POC, goals  Person educated: Patient Education method: Explanation, Demonstration, and Handouts Education comprehension: verbalized understanding and returned demonstration  HOME EXERCISE PROGRAM: Access Code: [email or web address] URL: [email or web address] Date: 07/01/2024 Prepared by: Maryanne Finder  Exercises - Sit to Stand with Arms Crossed  - 2-3 x daily - 5-7 x weekly - 3 sets - 10 reps - Standing March with Counter Support  - 2-3 x daily - 5-7 x weekly - 3 sets - 10 reps - Standing Hip Abduction with Counter Support  - 2-3 x daily - 5-7 x weekly - 3 sets - 10 reps - Standing Hip Extension with Counter Support  - 2-3 x daily - 5-7 x weekly - 3 sets - 10 reps - Heel Toe Raises with Counter Support  - 2-3 x daily - 5-7 x weekly -  3 sets - 10 reps  ASSESSMENT:  CLINICAL IMPRESSION:    Continued PT POC focused on L knee pain. Session focused on quad and hip strengthening. Good tolerance to activity. Utilized ankle weights this date due to  patient complaining of pain with resistance bands with HEP. Patient will benefit from skilled PT services to address listed impairments to improve quality of life and reduce pain.   OBJECTIVE IMPAIRMENTS: Abnormal gait, decreased balance, difficulty walking, decreased strength, postural dysfunction, and pain.   ACTIVITY LIMITATIONS: squatting and stairs  PARTICIPATION LIMITATIONS: community activity and yard work  PERSONAL FACTORS: Age, Fitness, and Time since onset of injury/illness/exacerbation are also affecting patient's functional outcome.   REHAB POTENTIAL: Good  CLINICAL DECISION MAKING: Stable/uncomplicated  EVALUATION COMPLEXITY: Low   GOALS: Goals reviewed with patient? Yes  SHORT TERM GOALS: Target date: 07/28/2024  Patient will be independent in HEP to improve strength/mobility for better functional independence with ADLs.  Baseline: 07/01/24: HEP initiated  Goal status: INITIAL   LONG TERM GOALS: Target date: 08/26/2024  Patient will increase lower extremity functional scale by 9 points to demonstrate improved functional mobility and increased tolerance with ADLs.  Baseline: 07/01/24: 62/80 = 77.5% Goal status: INITIAL  2.  Patient will increase BLE gross strength 1/3 MMT grade as to improve functional strength for independent gait, increased standing tolerance and increased ADL ability.  Baseline: 07/01/24: see above  Goal status: INITIAL  3.  Patient (> 45 years old) will improve five times sit to stand test by 3 seconds indicating an increased LE strength and improved balance.  Baseline: 07/01/24: 11.68 seconds  Goal status: INITIAL  4.  Patient will improve by 59m (164') in order to demonstrate clinically significant improvement in cardiopulmonary endurance and community ambulation  Baseline: 07/09/2024: 1430' with no AD  Goal status: INITIAL  5.  Patient will ascend/descend 4 stairs with reciprocal pattern and without rail assist independently without  loss of balance to improve ability to get in/out of home.  Baseline: 07/09/2024: reciprocal pattern on ascent with light B handrails, descend step sideways with railing  Goal status: INITIAL   PLAN:  PT FREQUENCY: 1-2x/week  PT DURATION: 8 weeks  PLANNED INTERVENTIONS: 97164- PT Re-evaluation, 97750- Physical Performance Testing, 97110-Therapeutic exercises, 97530- Therapeutic activity, 97112- Neuromuscular re-education, [phone]- Self Care, 02859- Manual therapy, [phone]- Gait training, [phone]- Electrical stimulation (unattended), [phone]- Electrical stimulation (manual), Patient/Family education, Balance training, Stair training, Joint mobilization, Joint manipulation, DME instructions, Cryotherapy, and Moist heat  PLAN FOR NEXT SESSION: assess stair negotiation, , BLE strengthening (functional activities included)   Maryanne Finder, PT, DPT Physical Therapist - [facility] 07/16/2024, 9:44 AM

## 2024-07-16 ENCOUNTER — Ambulatory Visit

## 2024-07-16 DIAGNOSIS — G8929 Other chronic pain: Secondary | ICD-10-CM

## 2024-07-16 DIAGNOSIS — M6281 Muscle weakness (generalized): Secondary | ICD-10-CM

## 2024-07-20 DIAGNOSIS — I071 Rheumatic tricuspid insufficiency: Secondary | ICD-10-CM | POA: Diagnosis not present

## 2024-07-20 DIAGNOSIS — I482 Chronic atrial fibrillation, unspecified: Secondary | ICD-10-CM | POA: Diagnosis not present

## 2024-07-20 DIAGNOSIS — I340 Nonrheumatic mitral (valve) insufficiency: Secondary | ICD-10-CM | POA: Diagnosis not present

## 2024-07-20 DIAGNOSIS — I272 Pulmonary hypertension, unspecified: Secondary | ICD-10-CM | POA: Diagnosis not present

## 2024-07-21 ENCOUNTER — Ambulatory Visit

## 2024-07-21 DIAGNOSIS — G8929 Other chronic pain: Secondary | ICD-10-CM

## 2024-07-21 DIAGNOSIS — M6281 Muscle weakness (generalized): Secondary | ICD-10-CM

## 2024-07-21 NOTE — Therapy (Signed)
 OUTPATIENT PHYSICAL THERAPY LOWER EXTREMITY TREATMENT   Patient Name: Cynthia Potts MRN: [id number] DOB:[birth year], 83 y.o., female Today's Date: 07/21/2024  END OF SESSION:  PT End of Session - 07/21/24 0815     Visit Number 4    Number of Visits 17    Date for Recertification  08/26/24    PT Start Time 0815    PT Stop Time 0856    PT Time Calculation (min) 41 min    Activity Tolerance Patient tolerated treatment well    Behavior During Therapy WFL for tasks assessed/performed             Past Medical History:  Diagnosis Date   Arthritis    Biatrial enlargement    GERD (gastroesophageal reflux disease)    HOH (hard of hearing)    Bilateral Hearing Aids   Moderate mitral regurgitation    Moderate tricuspid regurgitation    Persistent atrial fibrillation (HCC)    Psoriasis    Psoriatic arthritis (HCC)    RLS (restless legs syndrome)    Past Surgical History:  Procedure Laterality Date   CARDIAC CATHETERIZATION N/A 06/06/2015   Procedure: Right Heart Cath;  Surgeon: Ezra GORMAN Shuck, MD;  Location: [facility] INVASIVE CV LAB;  Service: Cardiovascular;  Laterality: N/A;   CATARACT EXTRACTION W/PHACO Left 08/23/2015   Procedure: CATARACT EXTRACTION PHACO AND INTRAOCULAR LENS PLACEMENT (IOC);  Surgeon: Elsie Carmine, MD;  Location: [facility] ORS;  Service: Ophthalmology;  Laterality: Left;  US  01:07 AP% 22.9 CDE 15.42 fluid pack lot #[id number]   CATARACT EXTRACTION W/PHACO Right 11/22/2015   Procedure: CATARACT EXTRACTION PHACO AND INTRAOCULAR LENS PLACEMENT (IOC);  Surgeon: Elsie Carmine, MD;  Location: [facility] ORS;  Service: Ophthalmology;  Laterality: Right;  US   0:58.2 AP%  15.2 CDE   8.98 fluid pack lot # [id number]   CYSTOSCOPY     ELECTROPHYSIOLOGIC STUDY N/A 02/28/2015   Procedure: Cardioversion;  Surgeon: Wolm JINNY Rhyme, MD;  Location: [facility] ORS;  Service: Cardiovascular;  Laterality: N/A;   ELECTROPHYSIOLOGIC STUDY N/A 03/24/2015   Procedure: Cardioversion;  Surgeon:  Wolm JINNY Rhyme, MD;  Location: [facility] ORS;  Service: Cardiovascular;  Laterality: N/A;   EYE SURGERY     KNEE ARTHROSCOPY Left 05/08/2017   Procedure: ARTHROSCOPY KNEE, Tear of the posterior horn of medical meniscus, Partial medical meniscectomy;  Surgeon: Mardee Lynwood SQUIBB, MD;  Location: [facility] ORS;  Service: Orthopedics;  Laterality: Left;   none     TEE WITHOUT CARDIOVERSION N/A 05/12/2015   Procedure: TRANSESOPHAGEAL ECHOCARDIOGRAM (TEE);  Surgeon: Maude JAYSON Emmer, MD;  Location: [facility] ENDOSCOPY;  Service: Cardiovascular;  Laterality: N/A;   TUBAL LIGATION     Patient Active Problem List   Diagnosis Date Noted   Paroxysmal atrial fibrillation (HCC) 01/22/2024   Vitamin D  deficiency 01/22/2024   Osteoarthritis of both knees 01/22/2024   Chronic pain of both knees 01/22/2024   Gastroesophageal reflux disease 01/22/2024   Hx of psoriatic arthritis 01/22/2024   Chest pain 09/12/2015   Moderate tricuspid insufficiency 06/03/2015   Persistent atrial fibrillation (HCC) 04/06/2015   Atrial flutter with controlled response (HCC) 03/24/2015   Moderate mitral insufficiency 02/11/2015   Awareness of heartbeats 02/24/2014    PCP: Gretel App, NP  REFERRING PROVIDER: Sharrie Barter, MD  REFERRING DIAG:  [phone] (ICD-10-CM) - Pain in left knee G89.29 (ICD-10-CM) - Other chronic pain M17.12 (ICD-10-CM) - Unilateral primary osteoarthritis, left knee M70.52 (ICD-10-CM) - Other bursitis of knee, left knee  THERAPY DIAG:  Chronic pain of left  knee  Muscle weakness (generalized)  Rationale for Evaluation and Treatment: Rehabilitation  ONSET DATE: chronic  SUBJECTIVE:   SUBJECTIVE STATEMENT: Patient reports she has arthritis in the L knee and got an injection last week in her L knee and was told she had bursitis. Patient reports she is active and walks a couple miles/day but no other strength training. Most of her complaints are in the medial aspect of her L knee.   PERTINENT HISTORY: Per MD  note on 06/26/2024, patient reports their symptoms are still intermittently occurring around her medial joint line. She currently rates pain severity as a 1/10. She feels her pain more with walking and stairs. She has increased pain after prolonged sitting and start up pain. She reports associated stiffness. She denies associated swelling, locking/catching, instability, numbness or tingling, weakness, fevers or chills, night sweats, loss, skin color change, pain at night. She has been using topical diclofenac/pain cream, acetaminophen  in addition to recent steroid injection, viscosupplementation with ongoing symptoms. Her last landmark guided steroid injection was on 11/15/2023 at [facility] which only provided 1 day of symptom relief.  PAIN:  Are you having pain? Yes: NPRS scale: 1-2/10; 4-5/10 at worst  Pain location: medial aspect of L knee Pain description: achy  Aggravating factors: prolonged sitting,  Relieving factors: resting  PRECAUTIONS: None  RED FLAGS: None   WEIGHT BEARING RESTRICTIONS: No  FALLS:  Has patient fallen in last 6 months? No  LIVING ENVIRONMENT: Lives with: lives with their spouse Lives in: House/apartment Stairs: 1 STE from garage; flight to basement that she accesses every day   OCCUPATION: Retired [employer] fundraiser  PLOF: Independent  PATIENT GOALS: for my knee to be more flexible and pain free    OBJECTIVE:  Note: Objective measures were completed at Evaluation unless otherwise noted.  DIAGNOSTIC FINDINGS:   EXAM: Left knee MRI without contrast performed 11/21/2016 at [facility]  IMPRESSION:  1. Irregular radial tear posterior horn medial meniscus.  2. 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  3. Potential sprain of the MCL with edema tracking superficial to the slightly thickened MCL.  4. Moderate knee effusion with superior plica.  5. There are 2 small chondral fragments posteriorly in the knee joint.  6. Very small Baker's cyst with adjacent  distal semimembranosus tendinopathy.  7. Linear irregularity in the distal lateral quadriceps tendon may be residua from an old injury.   PATIENT SURVEYS:  LEFS  Extreme difficulty/unable (0), Quite a bit of difficulty (1), Moderate difficulty (2), Little difficulty (3), No difficulty (4) Survey date:    Any of your usual work, housework or school activities 4  2. Usual hobbies, recreational or sporting activities 4  3. Getting into/out of the bath 4  4. Walking between rooms 4  5. Putting on socks/shoes 4  6. Squatting  1  7. Lifting an object, like a bag of groceries from the floor 4  8. Performing light activities around your home 4  9. Performing heavy activities around your home 3  10. Getting into/out of a car 3  11. Walking 2 blocks 4  12. Walking 1 mile 4  13. Going up/down 10 stairs (1 flight) 4  14. Standing for 1 hour 3  15.  sitting for 1 hour 4  16. Running on even ground 1  17. Running on uneven ground 1  18. Making sharp turns while running fast 1  19. Hopping  1  20. Rolling over in bed 4  Score total:  62/80 =  77.5%     COGNITION: Overall cognitive status: Within functional limits for tasks assessed     SENSATION: WFL  POSTURE: rounded shoulders and forward head  PALPATION: Mild tenderness to medial joint line of L knee   LOWER EXTREMITY ROM: WFL  LOWER EXTREMITY MMT:  MMT Right eval Left eval  Hip flexion 4 4  Hip extension    Hip abduction 4+ 4+  Hip adduction 4+ 4+  Hip internal rotation    Hip external rotation    Knee flexion 4 4  Knee extension 4 4  Ankle dorsiflexion 4+ 4+  Ankle plantarflexion    Ankle inversion    Ankle eversion     (Blank rows = not tested)  LOWER EXTREMITY SPECIAL TESTS:  Knee special tests: McMurray's test: negative and Thessaly test: negative  FUNCTIONAL TESTS:  5 times sit to stand: 11.68 seconds 6 minute walk test: to be completed at visit #2 10 meter walk test: to be completed at visit  #2  GAIT: Distance walked: 46' Assistive device utilized: None Level of assistance: Complete Independence Comments: mild trendelenburg bilaterally                                                                                                                                 TREATMENT DATE: 07/21/24      Subjective: Patient reports she tried to go for a walk yesterday morning and her L knee was bothering her.   Discussed considering new shoes as she is using old shoes for her walks   Therapeutic Exercise:   Leg press at OMEGA 3 x 10 - 55#  Knee extension at OMEGA 3 x 10 - 10# Hamstring curls at OMEGA 3 x 10 - 20#  Therapeutic Activity:   Fwd step downs 6 step 3 x 10 each LE   Kettlebell squat 10# KB 3 x 10   Sidestepping 4 x 30' while maintaining mini squat with GTB around knees  Standing heel raises with feet propped on half bolster 2 x 20   PATIENT EDUCATION:  Education details: HEP, POC, goals  Person educated: Patient Education method: Explanation, Demonstration, and Handouts Education comprehension: verbalized understanding and returned demonstration  HOME EXERCISE PROGRAM: Access Code: [email or web address] URL: [email or web address] Date: 07/01/2024 Prepared by: Maryanne Finder  Exercises - Sit to Stand with Arms Crossed  - 2-3 x daily - 5-7 x weekly - 3 sets - 10 reps - Standing March with Counter Support  - 2-3 x daily - 5-7 x weekly - 3 sets - 10 reps - Standing Hip Abduction with Counter Support  - 2-3 x daily - 5-7 x weekly - 3 sets - 10 reps - Standing Hip Extension with Counter Support  - 2-3 x daily - 5-7 x weekly - 3 sets - 10 reps - Heel Toe Raises with Counter Support  - 2-3 x daily - 5-7 x weekly - 3 sets - 10 reps  ASSESSMENT:  CLINICAL IMPRESSION:     Continued PT POC focused on L knee pain. Session focused on general LE strengthening with increased resistance this date. Tolerated session well with no increase in pain. Patient will benefit  from skilled PT services to address listed impairments to improve quality of life and reduce pain.   OBJECTIVE IMPAIRMENTS: Abnormal gait, decreased balance, difficulty walking, decreased strength, postural dysfunction, and pain.   ACTIVITY LIMITATIONS: squatting and stairs  PARTICIPATION LIMITATIONS: community activity and yard work  PERSONAL FACTORS: Age, Fitness, and Time since onset of injury/illness/exacerbation are also affecting patient's functional outcome.   REHAB POTENTIAL: Good  CLINICAL DECISION MAKING: Stable/uncomplicated  EVALUATION COMPLEXITY: Low   GOALS: Goals reviewed with patient? Yes  SHORT TERM GOALS: Target date: 07/28/2024  Patient will be independent in HEP to improve strength/mobility for better functional independence with ADLs.  Baseline: 07/01/24: HEP initiated  Goal status: INITIAL   LONG TERM GOALS: Target date: 08/26/2024  Patient will increase lower extremity functional scale by 9 points to demonstrate improved functional mobility and increased tolerance with ADLs.  Baseline: 07/01/24: 62/80 = 77.5% Goal status: INITIAL  2.  Patient will increase BLE gross strength 1/3 MMT grade as to improve functional strength for independent gait, increased standing tolerance and increased ADL ability.  Baseline: 07/01/24: see above  Goal status: INITIAL  3.  Patient (> 16 years old) will improve five times sit to stand test by 3 seconds indicating an increased LE strength and improved balance.  Baseline: 07/01/24: 11.68 seconds  Goal status: INITIAL  4.  Patient will improve by 51m (164') in order to demonstrate clinically significant improvement in cardiopulmonary endurance and community ambulation  Baseline: 07/09/2024: 1430' with no AD  Goal status: INITIAL  5.  Patient will ascend/descend 4 stairs with reciprocal pattern and without rail assist independently without loss of balance to improve ability to get in/out of home.  Baseline: 07/09/2024:  reciprocal pattern on ascent with light B handrails, descend step sideways with railing  Goal status: INITIAL   PLAN:  PT FREQUENCY: 1-2x/week  PT DURATION: 8 weeks  PLANNED INTERVENTIONS: 97164- PT Re-evaluation, 97750- Physical Performance Testing, 97110-Therapeutic exercises, 97530- Therapeutic activity, 97112- Neuromuscular re-education, [phone]- Self Care, 02859- Manual therapy, [phone]- Gait training, [phone]- Electrical stimulation (unattended), [phone]- Electrical stimulation (manual), Patient/Family education, Balance training, Stair training, Joint mobilization, Joint manipulation, DME instructions, Cryotherapy, and Moist heat  PLAN FOR NEXT SESSION: assess stair negotiation, , BLE strengthening (functional activities included)   Maryanne Finder, PT, DPT Physical Therapist - [place]  [facility] 07/21/2024, 8:15 AM

## 2024-07-27 NOTE — Therapy (Signed)
 OUTPATIENT PHYSICAL THERAPY LOWER EXTREMITY TREATMENT   Patient Name: Cynthia Potts MRN: [id number] DOB:09/23/1941, 83 y.o., female Today's Date: 07/27/2024  END OF SESSION:       Past Medical History:  Diagnosis Date   Arthritis    Biatrial enlargement    GERD (gastroesophageal reflux disease)    HOH (hard of hearing)    Bilateral Hearing Aids   Moderate mitral regurgitation    Moderate tricuspid regurgitation    Persistent atrial fibrillation (HCC)    Psoriasis    Psoriatic arthritis (HCC)    RLS (restless legs syndrome)    Past Surgical History:  Procedure Laterality Date   CARDIAC CATHETERIZATION N/A 06/06/2015   Procedure: Right Heart Cath;  Surgeon: Ezra GORMAN Shuck, MD;  Location: [facility] INVASIVE CV LAB;  Service: Cardiovascular;  Laterality: N/A;   CATARACT EXTRACTION W/PHACO Left 08/23/2015   Procedure: CATARACT EXTRACTION PHACO AND INTRAOCULAR LENS PLACEMENT (IOC);  Surgeon: Elsie Carmine, MD;  Location: [facility] ORS;  Service: Ophthalmology;  Laterality: Left;  US  01:07 AP% 22.9 CDE 15.42 fluid pack lot #[id number]   CATARACT EXTRACTION W/PHACO Right 11/22/2015   Procedure: CATARACT EXTRACTION PHACO AND INTRAOCULAR LENS PLACEMENT (IOC);  Surgeon: Elsie Carmine, MD;  Location: [facility] ORS;  Service: Ophthalmology;  Laterality: Right;  US   0:58.2 AP%  15.2 CDE   8.98 fluid pack lot # [id number]   CYSTOSCOPY     ELECTROPHYSIOLOGIC STUDY N/A 02/28/2015   Procedure: Cardioversion;  Surgeon: Wolm JINNY Rhyme, MD;  Location: [facility] ORS;  Service: Cardiovascular;  Laterality: N/A;   ELECTROPHYSIOLOGIC STUDY N/A 03/24/2015   Procedure: Cardioversion;  Surgeon: Wolm JINNY Rhyme, MD;  Location: [facility] ORS;  Service: Cardiovascular;  Laterality: N/A;   EYE SURGERY     KNEE ARTHROSCOPY Left 05/08/2017   Procedure: ARTHROSCOPY KNEE, Tear of the posterior horn of medical meniscus, Partial medical meniscectomy;  Surgeon: Mardee Lynwood SQUIBB, MD;  Location: [facility] ORS;  Service: Orthopedics;   Laterality: Left;   none     TEE WITHOUT CARDIOVERSION N/A 05/12/2015   Procedure: TRANSESOPHAGEAL ECHOCARDIOGRAM (TEE);  Surgeon: Maude JAYSON Emmer, MD;  Location: [facility] ENDOSCOPY;  Service: Cardiovascular;  Laterality: N/A;   TUBAL LIGATION     Patient Active Problem List   Diagnosis Date Noted   Paroxysmal atrial fibrillation (HCC) 01/22/2024   Vitamin D  deficiency 01/22/2024   Osteoarthritis of both knees 01/22/2024   Chronic pain of both knees 01/22/2024   Gastroesophageal reflux disease 01/22/2024   Hx of psoriatic arthritis 01/22/2024   Chest pain 09/12/2015   Moderate tricuspid insufficiency 06/03/2015   Persistent atrial fibrillation (HCC) 04/06/2015   Atrial flutter with controlled response (HCC) 03/24/2015   Moderate mitral insufficiency 02/11/2015   Awareness of heartbeats 02/24/2014    PCP: Gretel App, NP  REFERRING PROVIDER: Sharrie Barter, MD  REFERRING DIAG:  [id number] (ICD-10-CM) - Pain in left knee G89.29 (ICD-10-CM) - Other chronic pain M17.12 (ICD-10-CM) - Unilateral primary osteoarthritis, left knee M70.52 (ICD-10-CM) - Other bursitis of knee, left knee  THERAPY DIAG:  Chronic pain of left knee  Muscle weakness (generalized)  Rationale for Evaluation and Treatment: Rehabilitation  ONSET DATE: chronic  SUBJECTIVE:   SUBJECTIVE STATEMENT: Patient reports she has arthritis in the L knee and got an injection last week in her L knee and was told she had bursitis. Patient reports she is active and walks a couple miles/day but no other strength training. Most of her complaints are in the medial aspect of her L knee.   PERTINENT  HISTORY: Per MD note on 06/26/2024, patient reports their symptoms are still intermittently occurring around her medial joint line. She currently rates pain severity as a 1/10. She feels her pain more with walking and stairs. She has increased pain after prolonged sitting and start up pain. She reports associated stiffness. She denies  associated swelling, locking/catching, instability, numbness or tingling, weakness, fevers or chills, night sweats, loss, skin color change, pain at night. She has been using topical diclofenac/pain cream, acetaminophen  in addition to recent steroid injection, viscosupplementation with ongoing symptoms. Her last landmark guided steroid injection was on 11/15/2023 at [facility] which only provided 1 day of symptom relief.  PAIN:  Are you having pain? Yes: NPRS scale: 1-2/10; 4-5/10 at worst  Pain location: medial aspect of L knee Pain description: achy  Aggravating factors: prolonged sitting,  Relieving factors: resting  PRECAUTIONS: None  RED FLAGS: None   WEIGHT BEARING RESTRICTIONS: No  FALLS:  Has patient fallen in last 6 months? No  LIVING ENVIRONMENT: Lives with: lives with their spouse Lives in: House/apartment Stairs: 1 STE from garage; flight to basement that she accesses every day   OCCUPATION: Retired [employer] fundraiser  PLOF: Independent  PATIENT GOALS: for my knee to be more flexible and pain free    OBJECTIVE:  Note: Objective measures were completed at Evaluation unless otherwise noted.  DIAGNOSTIC FINDINGS:   EXAM: Left knee MRI without contrast performed 11/21/2016 at [facility]  IMPRESSION:  1. Irregular radial tear posterior horn medial meniscus.  2. 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  3. Potential sprain of the MCL with edema tracking superficial to the slightly thickened MCL.  4. Moderate knee effusion with superior plica.  5. There are 2 small chondral fragments posteriorly in the knee joint.  6. Very small Baker's cyst with adjacent distal semimembranosus tendinopathy.  7. Linear irregularity in the distal lateral quadriceps tendon may be residua from an old injury.   PATIENT SURVEYS:  LEFS  Extreme difficulty/unable (0), Quite a bit of difficulty (1), Moderate difficulty (2), Little difficulty (3), No difficulty (4) Survey date:    Any of  your usual work, housework or school activities 4  2. Usual hobbies, recreational or sporting activities 4  3. Getting into/out of the bath 4  4. Walking between rooms 4  5. Putting on socks/shoes 4  6. Squatting  1  7. Lifting an object, like a bag of groceries from the floor 4  8. Performing light activities around your home 4  9. Performing heavy activities around your home 3  10. Getting into/out of a car 3  11. Walking 2 blocks 4  12. Walking 1 mile 4  13. Going up/down 10 stairs (1 flight) 4  14. Standing for 1 hour 3  15.  sitting for 1 hour 4  16. Running on even ground 1  17. Running on uneven ground 1  18. Making sharp turns while running fast 1  19. Hopping  1  20. Rolling over in bed 4  Score total:  62/80 = 77.5%     COGNITION: Overall cognitive status: Within functional limits for tasks assessed     SENSATION: WFL  POSTURE: rounded shoulders and forward head  PALPATION: Mild tenderness to medial joint line of L knee   LOWER EXTREMITY ROM: WFL  LOWER EXTREMITY MMT:  MMT Right eval Left eval  Hip flexion 4 4  Hip extension    Hip abduction 4+ 4+  Hip adduction 4+ 4+  Hip internal  rotation    Hip external rotation    Knee flexion 4 4  Knee extension 4 4  Ankle dorsiflexion 4+ 4+  Ankle plantarflexion    Ankle inversion    Ankle eversion     (Blank rows = not tested)  LOWER EXTREMITY SPECIAL TESTS:  Knee special tests: McMurray's test: negative and Thessaly test: negative  FUNCTIONAL TESTS:  5 times sit to stand: 11.68 seconds 6 minute walk test: to be completed at visit #2 10 meter walk test: to be completed at visit #2  GAIT: Distance walked: 66' Assistive device utilized: None Level of assistance: Complete Independence Comments: mild trendelenburg bilaterally                                                                                                                                 TREATMENT DATE: 07/27/24     ***  Subjective:  Patient reports she tried to go for a walk yesterday morning and her L knee was bothering her.   Discussed considering new shoes as she is using old shoes for her walks   Therapeutic Exercise:   Leg press at OMEGA 3 x 10 - 55#  Knee extension at OMEGA 3 x 10 - 10# Hamstring curls at OMEGA 3 x 10 - 20#  Therapeutic Activity:   Fwd step downs 6 step 3 x 10 each LE   Kettlebell squat 10# KB 3 x 10   Sidestepping 4 x 30' while maintaining mini squat with GTB around knees  Standing heel raises with feet propped on half bolster 2 x 20   PATIENT EDUCATION:  Education details: HEP, POC, goals  Person educated: Patient Education method: Explanation, Demonstration, and Handouts Education comprehension: verbalized understanding and returned demonstration  HOME EXERCISE PROGRAM: Access Code: [email or web address] URL: [email or web address] Date: 07/01/2024 Prepared by: Maryanne Finder  Exercises - Sit to Stand with Arms Crossed  - 2-3 x daily - 5-7 x weekly - 3 sets - 10 reps - Standing March with Counter Support  - 2-3 x daily - 5-7 x weekly - 3 sets - 10 reps - Standing Hip Abduction with Counter Support  - 2-3 x daily - 5-7 x weekly - 3 sets - 10 reps - Standing Hip Extension with Counter Support  - 2-3 x daily - 5-7 x weekly - 3 sets - 10 reps - Heel Toe Raises with Counter Support  - 2-3 x daily - 5-7 x weekly - 3 sets - 10 reps  ASSESSMENT:  CLINICAL IMPRESSION:    ***  Continued PT POC focused on L knee pain. Session focused on general LE strengthening with increased resistance this date. Tolerated session well with no increase in pain. Patient will benefit from skilled PT services to address listed impairments to improve quality of life and reduce pain.   OBJECTIVE IMPAIRMENTS: Abnormal gait, decreased balance, difficulty walking, decreased strength, postural dysfunction, and pain.   ACTIVITY LIMITATIONS: squatting and stairs  PARTICIPATION LIMITATIONS: community activity  and yard work  PERSONAL FACTORS: Age, Fitness, and Time since onset of injury/illness/exacerbation are also affecting patient's functional outcome.   REHAB POTENTIAL: Good  CLINICAL DECISION MAKING: Stable/uncomplicated  EVALUATION COMPLEXITY: Low   GOALS: Goals reviewed with patient? Yes  SHORT TERM GOALS: Target date: 07/28/2024  Patient will be independent in HEP to improve strength/mobility for better functional independence with ADLs.  Baseline: 07/01/24: HEP initiated  Goal status: INITIAL   LONG TERM GOALS: Target date: 08/26/2024  Patient will increase lower extremity functional scale by 9 points to demonstrate improved functional mobility and increased tolerance with ADLs.  Baseline: 07/01/24: 62/80 = 77.5% Goal status: INITIAL  2.  Patient will increase BLE gross strength 1/3 MMT grade as to improve functional strength for independent gait, increased standing tolerance and increased ADL ability.  Baseline: 07/01/24: see above  Goal status: INITIAL  3.  Patient (> 80 years old) will improve five times sit to stand test by 3 seconds indicating an increased LE strength and improved balance.  Baseline: 07/01/24: 11.68 seconds  Goal status: INITIAL  4.  Patient will improve by 44m (164') in order to demonstrate clinically significant improvement in cardiopulmonary endurance and community ambulation  Baseline: 07/09/2024: 1430' with no AD  Goal status: INITIAL  5.  Patient will ascend/descend 4 stairs with reciprocal pattern and without rail assist independently without loss of balance to improve ability to get in/out of home.  Baseline: 07/09/2024: reciprocal pattern on ascent with light B handrails, descend step sideways with railing  Goal status: INITIAL   PLAN:  PT FREQUENCY: 1-2x/week  PT DURATION: 8 weeks  PLANNED INTERVENTIONS: 97164- PT Re-evaluation, 97750- Physical Performance Testing, 97110-Therapeutic exercises, 97530- Therapeutic activity, 97112-  Neuromuscular re-education, [phone]- Self Care, 02859- Manual therapy, [phone]- Gait training, [phone]- Electrical stimulation (unattended), [phone]- Electrical stimulation (manual), Patient/Family education, Balance training, Stair training, Joint mobilization, Joint manipulation, DME instructions, Cryotherapy, and Moist heat  PLAN FOR NEXT SESSION: assess stair negotiation, , BLE strengthening (functional activities included)   Maryanne Finder, PT, DPT Physical Therapist - [place]  [facility] 07/27/2024, 12:22 PM

## 2024-07-28 ENCOUNTER — Ambulatory Visit

## 2024-07-28 ENCOUNTER — Encounter

## 2024-07-28 DIAGNOSIS — M6281 Muscle weakness (generalized): Secondary | ICD-10-CM | POA: Diagnosis not present

## 2024-07-28 DIAGNOSIS — G8929 Other chronic pain: Secondary | ICD-10-CM

## 2024-07-30 ENCOUNTER — Encounter

## 2024-08-04 ENCOUNTER — Encounter

## 2024-08-05 ENCOUNTER — Encounter

## 2024-08-06 ENCOUNTER — Encounter

## 2024-08-07 DIAGNOSIS — I272 Pulmonary hypertension, unspecified: Secondary | ICD-10-CM | POA: Diagnosis not present

## 2024-08-07 DIAGNOSIS — R600 Localized edema: Secondary | ICD-10-CM | POA: Diagnosis not present

## 2024-08-07 DIAGNOSIS — I482 Chronic atrial fibrillation, unspecified: Secondary | ICD-10-CM | POA: Diagnosis not present

## 2024-08-07 DIAGNOSIS — I071 Rheumatic tricuspid insufficiency: Secondary | ICD-10-CM | POA: Diagnosis not present

## 2024-08-07 DIAGNOSIS — I340 Nonrheumatic mitral (valve) insufficiency: Secondary | ICD-10-CM | POA: Diagnosis not present

## 2024-08-11 ENCOUNTER — Encounter

## 2024-08-13 ENCOUNTER — Encounter

## 2024-08-17 ENCOUNTER — Encounter

## 2024-08-17 DIAGNOSIS — I071 Rheumatic tricuspid insufficiency: Secondary | ICD-10-CM | POA: Diagnosis not present

## 2024-08-17 DIAGNOSIS — R600 Localized edema: Secondary | ICD-10-CM | POA: Diagnosis not present

## 2024-08-19 ENCOUNTER — Ambulatory Visit (INDEPENDENT_AMBULATORY_CARE_PROVIDER_SITE_OTHER): Admitting: Nurse Practitioner

## 2024-08-19 ENCOUNTER — Encounter

## 2024-08-19 ENCOUNTER — Encounter: Payer: Self-pay | Admitting: Nurse Practitioner

## 2024-08-19 VITALS — BP 110/86 | HR 69 | Temp 97.7°F | Ht 63.0 in | Wt 136.8 lb

## 2024-08-19 DIAGNOSIS — R600 Localized edema: Secondary | ICD-10-CM | POA: Diagnosis not present

## 2024-08-19 DIAGNOSIS — I482 Chronic atrial fibrillation, unspecified: Secondary | ICD-10-CM

## 2024-08-19 DIAGNOSIS — I272 Pulmonary hypertension, unspecified: Secondary | ICD-10-CM

## 2024-08-19 DIAGNOSIS — R7309 Other abnormal glucose: Secondary | ICD-10-CM

## 2024-08-19 LAB — POCT GLYCOSYLATED HEMOGLOBIN (HGB A1C)
HbA1c POC (<> result, manual entry): 5.1 % (ref 4.0–5.6)
HbA1c, POC (controlled diabetic range): 5.1 % (ref 0.0–7.0)
HbA1c, POC (prediabetic range): 5.1 % — AB (ref 5.7–6.4)
Hemoglobin A1C: 5.1 % (ref 4.0–5.6)

## 2024-08-19 NOTE — Assessment & Plan Note (Signed)
 Chronic atrial fibrillation is effectively managed by cardiology with metoprolol  for rate control and eliquis. Continue Eliquis 5 mg twice daily and metoprolol  25 mg three times daily. Monitor for new palpitations or bleeding issues. Follow up with cardiology as scheduled.

## 2024-08-19 NOTE — Progress Notes (Signed)
 Leron Glance, NP-C Phone: [phone]  RHONDALYN CLINGAN is a 83 y.o. female who presents today for follow up.   Discussed the use of AI scribe software for clinical note transcription with the patient, who gave verbal consent to proceed.  History of Present Illness   FLORDIA KASSEM is an 83 year old female with chronic atrial fibrillation who presents with concerns about elevated blood sugar and kidney function.  She has been experiencing swelling in her ankles, which typically occurs after long trips but usually subsides. Recently, the swelling did not resolve as expected. She was prescribed Lasix  (furosemide ) to be taken every other day, which she has been following. Despite this, her ankles remain 'a little bit thicker now.'  She underwent lab work to check her kidney function. The results showed an elevated BUN level. Her GFR and creatinine levels were normal.  During the same lab work, her blood sugar was noted to be 133 mg/dL. She was not fasting at the time of the test, having eaten earlier in the morning. She is concerned about the possibility of diabetes due to her family history, as her mother and other family members have diabetes.  Her current medications include Lasix  every other day, Eliquis for atrial fibrillation, and metoprolol , which she takes one tablet in the morning and two at night.  No feeling unwell despite lab abnormalities; reports a 'bad knee' but no other significant complaints.      Social History   Tobacco Use  Smoking Status Former   Current packs/day: 0.00   Average packs/day: 1 pack/day for 30.0 years (30.0 ttl pk-yrs)   Types: Cigarettes   Start date: 07/08/1953   Quit date: 07/09/1983   Years since quitting: 41.1  Smokeless Tobacco Never    Current Outpatient Medications on File Prior to Visit  Medication Sig Dispense Refill   apixaban (ELIQUIS) 5 MG TABS tablet Take 5 mg by mouth 2 (two) times daily.      Cholecalciferol (EQL VITAMIN D3) 2000  units CAPS Take 2,000 Units by mouth daily.     clobetasol  (TEMOVATE ) 0.05 % external solution as needed.     Coenzyme Q10 (CO Q-10) 300 MG CAPS Take 300 mg by mouth daily.     furosemide  (LASIX ) 20 MG tablet Take 20 mg by mouth.     magnesium oxide (MAG-OX) 400 MG tablet Take 400 mg by mouth daily.     metoprolol  succinate (TOPROL -XL) 25 MG 24 hr tablet Take 1 tablet by mouth 3 (three) times daily.     Multiple Vitamins-Minerals (ONE-A-DAY WOMENS 50+ ADVANTAGE PO) Take 1 tablet by mouth daily.     omeprazole  (PRILOSEC) 20 MG capsule Take 1 capsule (20 mg total) by mouth daily. 90 capsule 3   No current facility-administered medications on file prior to visit.     ROS see history of present illness  Objective  Physical Exam Vitals:   08/19/24 1457  BP: 110/86  Pulse: 69  Temp: 97.7 F (36.5 C)  SpO2: 95%    BP Readings from Last 3 Encounters:  08/19/24 110/86  01/22/24 96/60  04/03/23 125/77   Wt Readings from Last 3 Encounters:  08/19/24 136 lb 12.8 oz (62.1 kg)  01/22/24 135 lb 12.8 oz (61.6 kg)  05/08/17 130 lb (59 kg)    Physical Exam Constitutional:      General: She is not in acute distress.    Appearance: Normal appearance.  HENT:     Head: Normocephalic.  Cardiovascular:  Rate and Rhythm: Normal rate. Rhythm irregularly irregular.     Heart sounds: Normal heart sounds.  Pulmonary:     Effort: Pulmonary effort is normal.     Breath sounds: Normal breath sounds.  Musculoskeletal:     Right lower leg: No edema.     Left lower leg: No edema.  Skin:    General: Skin is warm and dry.  Neurological:     General: No focal deficit present.     Mental Status: She is alert.  Psychiatric:        Mood and Affect: Mood normal.        Behavior: Behavior normal.      Assessment/Plan: Please see individual problem list.  Elevated glucose Assessment & Plan: Noted in recent lab work. Patient was not fasting. No Hx of diabetes/prediabetes. POCT A1c 5.1  today. Reassurance provided.   Orders: -     POCT glycosylated hemoglobin (Hb A1C)  Pedal edema Assessment & Plan: Intermittent edema has improved with Lasix , and current swelling has resolved. Advised to only take Lasix  as needed.   Chronic atrial fibrillation [facility]) Assessment & Plan: Chronic atrial fibrillation is effectively managed by cardiology with metoprolol  for rate control and eliquis. Continue Eliquis 5 mg twice daily and metoprolol  25 mg three times daily. Monitor for new palpitations or bleeding issues. Follow up with cardiology as scheduled.   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 Follow up with cardiology as scheduled.      Return for as needed.   Leron Glance, NP-C [facility] - [facility]

## 2024-08-19 NOTE — Assessment & Plan Note (Signed)
 Continue current management and monitor symptoms. Follow up with cardiology as scheduled.

## 2024-08-19 NOTE — Assessment & Plan Note (Signed)
 Intermittent edema has improved with Lasix , and current swelling has resolved. Advised to only take Lasix  as needed.

## 2024-08-19 NOTE — Assessment & Plan Note (Addendum)
 Noted in recent lab work. Patient was not fasting. No Hx of diabetes/prediabetes. POCT A1c 5.1 today. Reassurance provided.

## 2024-08-24 ENCOUNTER — Encounter

## 2024-08-26 ENCOUNTER — Encounter

## 2024-08-31 ENCOUNTER — Encounter

## 2024-09-02 ENCOUNTER — Encounter

## 2024-09-09 ENCOUNTER — Encounter

## 2024-09-14 ENCOUNTER — Encounter

## 2024-09-16 ENCOUNTER — Ambulatory Visit: Payer: Self-pay

## 2024-09-16 ENCOUNTER — Encounter: Payer: Self-pay | Admitting: Emergency Medicine

## 2024-09-16 ENCOUNTER — Emergency Department
Admission: EM | Admit: 2024-09-16 | Discharge: 2024-09-16 | Disposition: A | Discharge status: Home / Self Care | Attending: Emergency Medicine | Admitting: Emergency Medicine

## 2024-09-16 ENCOUNTER — Other Ambulatory Visit: Payer: Self-pay

## 2024-09-16 DIAGNOSIS — Z23 Encounter for immunization: Secondary | ICD-10-CM | POA: Insufficient documentation

## 2024-09-16 DIAGNOSIS — W5503XA Scratched by cat, initial encounter: Secondary | ICD-10-CM | POA: Diagnosis not present

## 2024-09-16 DIAGNOSIS — Z7901 Long term (current) use of anticoagulants: Secondary | ICD-10-CM | POA: Diagnosis not present

## 2024-09-16 DIAGNOSIS — S61412A Laceration without foreign body of left hand, initial encounter: Secondary | ICD-10-CM | POA: Insufficient documentation

## 2024-09-16 DIAGNOSIS — I4891 Unspecified atrial fibrillation: Secondary | ICD-10-CM | POA: Diagnosis not present

## 2024-09-16 DIAGNOSIS — S6992XA Unspecified injury of left wrist, hand and finger(s), initial encounter: Secondary | ICD-10-CM | POA: Diagnosis present

## 2024-09-16 MED ORDER — TETANUS-DIPHTH-ACELL PERTUSSIS 5-2-15.5 LF-MCG/0.5 IM SUSP
0.5000 mL | Freq: Once | INTRAMUSCULAR | Status: AC
  Administered 2024-09-16: 0.5 mL via INTRAMUSCULAR
  Filled 2024-09-16: qty 0.5

## 2024-09-16 MED ORDER — LIDOCAINE-EPINEPHRINE-TETRACAINE (LET) TOPICAL GEL
3.0000 mL | Freq: Once | TOPICAL | Status: AC
  Administered 2024-09-16: 3 mL via TOPICAL
  Filled 2024-09-16: qty 3

## 2024-09-16 MED ORDER — LIDOCAINE-EPINEPHRINE 2 %-1:100000 IJ SOLN
20.0000 mL | Freq: Once | INTRAMUSCULAR | Status: AC
  Administered 2024-09-16: 20 mL via INTRADERMAL
  Filled 2024-09-16: qty 1

## 2024-09-16 MED ORDER — AMOXICILLIN-POT CLAVULANATE 875-125 MG PO TABS: 1.0000 | ORAL_TABLET | Freq: Two times a day (BID) | ORAL | 0 refills | Status: DC

## 2024-09-16 NOTE — ED Provider Notes (Signed)
 [facility] Provider Note    Event Date/Time   First MD Initiated Contact with Patient 09/16/24 [phone]     (approximate)   History   Laceration   HPI  Cynthia Potts is a 83 y.o. female with a past medical history of atrial flutter/atrial fibrillation on Eliquis who presents today for evaluation of left hand laceration.  Patient reports that her cat jumped and its back claws scraped her hand.  She reports that she sustained a laceration.  She reports that she has been bleeding a lot because she is on Eliquis.  No numbness or tingling.  Patient Active Problem List   Diagnosis Date Noted   Elevated glucose 08/19/2024   Pedal edema 08/19/2024   Chronic atrial fibrillation (HCC) 01/22/2024   Vitamin D  deficiency 01/22/2024   Osteoarthritis of both knees 01/22/2024   Gastroesophageal reflux disease 01/22/2024   Hx of psoriatic arthritis 01/22/2024   Chest pain 09/12/2015   Moderate to severe pulmonary hypertension (HCC) 06/03/2015   Moderate tricuspid insufficiency 06/03/2015   Atrial flutter with controlled response (HCC) 03/24/2015   Moderate mitral insufficiency 02/11/2015          Physical Exam   Triage Vital Signs: ED Triage Vitals  Encounter Vitals Group     BP 09/16/24 0500 (!) 154/103     Girls Systolic BP Percentile --      Girls Diastolic BP Percentile --      Boys Systolic BP Percentile --      Boys Diastolic BP Percentile --      Pulse Rate 09/16/24 0500 92     Resp 09/16/24 0500 19     Temp 09/16/24 0500 97.6 F (36.4 C)     Temp Source 09/16/24 0500 Oral     SpO2 09/16/24 0500 98 %     Weight 09/16/24 0503 140 lb (63.5 kg)     Height 09/16/24 0503 5' 3 (1.6 m)     Head Circumference --      Peak Flow --      Pain Score 09/16/24 0503 5     Pain Loc --      Pain Education --      Exclude from Growth Chart --     Most recent vital signs: Vitals:   09/16/24 0500  BP: (!) 154/103  Pulse: 92  Resp: 19  Temp: 97.6 F  (36.4 C)  SpO2: 98%    Physical Exam Vitals and nursing note reviewed.  Constitutional:      General: Awake and alert. No acute distress.    Appearance: Normal appearance. The patient is normal weight.  HENT:     Head: Normocephalic and atraumatic.     Mouth: Mucous membranes are moist.  Eyes:     General: PERRL. Normal EOMs        Right eye: No discharge.        Left eye: No discharge.     Conjunctiva/sclera: Conjunctivae normal.  Cardiovascular:     Rate and Rhythm: Normal rate.     Pulses: Normal pulses.  Pulmonary:     Effort: Pulmonary effort is normal. No respiratory distress.     Breath sounds: Normal breath sounds.  Abdominal:     Abdomen is soft. There is no abdominal tenderness. No rebound or guarding. No distention. Musculoskeletal:        General: No swelling. Normal range of motion.     Cervical back: Normal range of motion and neck supple.  Left hand: 5 cm jagged laceration with visible subcutaneous fatty tissue over the dorsum of the hand between the thumb and index finger.  Normal range of motion of all fingers.  Normal abduction and adduction of thumb against resistance. Skin:    General: Skin is warm and dry.     Capillary Refill: Capillary refill takes less than 2 seconds.     Findings: No rash.  Neurological:     Mental Status: The patient is awake and alert.      ED Results / Procedures / Treatments   Labs (all labs ordered are listed, but only abnormal results are displayed) Labs Reviewed - No data to display   EKG     RADIOLOGY     PROCEDURES:  Critical Care performed:   .Laceration Repair  Date/Time: 09/16/2024 9:56 AM  Performed by: Charlyn Vialpando E, PA-C Authorized by: Antia Rahal E, PA-C   Consent:    Consent obtained:  Verbal   Consent given by:  Patient   Risks, benefits, and alternatives were discussed: yes     Risks discussed:  Infection, need for additional repair, nerve damage, pain, poor cosmetic result, poor wound  healing, retained foreign body, tendon damage and vascular damage   Alternatives discussed:  No treatment Universal protocol:    Procedure explained and questions answered to patient or proxy's satisfaction: yes     Relevant documents present and verified: yes     Test results available: yes     Required blood products, implants, devices, and special equipment available: yes     Site/side marked: yes     Immediately prior to procedure, a time out was called: yes     Patient identity confirmed:  Verbally with patient Anesthesia:    Anesthesia method:  Topical application and local infiltration   Topical anesthetic:  LET   Local anesthetic:  Lidocaine  2% WITH epi Laceration details:    Location:  Hand   Hand location:  L hand, dorsum   Length (cm):  5   Depth (mm):  4 Pre-procedure details:    Preparation:  Patient was prepped and draped in usual sterile fashion Exploration:    Limited defect created (wound extended): no     Hemostasis achieved with:  Direct pressure, LET and epinephrine    Imaging outcome: foreign body not noted     Wound exploration: wound explored through full range of motion and entire depth of wound visualized     Wound extent: no foreign body, no nerve damage, no tendon damage and no underlying fracture   Treatment:    Area cleansed with:  Povidone-iodine  and saline   Amount of cleaning:  Extensive   Irrigation solution:  Sterile saline   Irrigation method:  Pressure wash   Debridement:  None   Undermining:  None   Scar revision: no   Skin repair:    Repair method:  Sutures   Suture size:  5-0   Suture material:  Nylon   Suture technique:  Simple interrupted   Number of sutures:  6 Repair type:    Repair type:  Intermediate Post-procedure details:    Dressing:  Sterile dressing and non-adherent dressing   Procedure completion:  Tolerated well, no immediate complications    MEDICATIONS ORDERED IN ED: Medications  lidocaine -EPINEPHrine -tetracaine   (LET) topical gel (3 mLs Topical Given 09/16/24 0817)  lidocaine -EPINEPHrine  (XYLOCAINE  W/EPI) 2 %-1:100000 (with pres) injection 20 mL (20 mLs Intradermal Given by Other 09/16/24 0854)  Tdap (ADACEL ) injection 0.5 mL (  0.5 mLs Intramuscular Given 09/16/24 0814)     IMPRESSION / MDM / ASSESSMENT AND PLAN / ED COURSE  I reviewed the triage vital signs and the nursing notes.   Differential diagnosis includes, but is not limited to, laceration, skin tear, cat scratch.  Patient is awake and alert, hemodynamically stable and afebrile.  She is nontoxic in appearance.  She has 5 cm laceration over the dorsum of the hand between the thumb and index finger.  She has normal and full range of motion of all fingers against resistance, no visible foreign body, no evidence of tendon injury, no bony tenderness to suspect fracture.  She does have bleeding, secondary to her Eliquis.  Let was applied first, and then area was anesthetized with lidocaine  with epinephrine .  The area was irrigated extensively with normal saline and also iodine  with pressure irrigation.  The wound was subsequently closed with sutures, though not tightly as I do not want to increase her risk for developing an infection given the dirty nature of the wound from the cat's claw.  We discussed the option of rabies series, though she reports the cat was her own and is up-to-date on its rabies shots.  She was given an updated tetanus shot.  We discussed wound care, timeline for suture removal, and return precautions.  She was started on antibiotics.  Patient understands and agrees with plan.  Discharged in stable condition.   Patient's presentation is most consistent with acute complicated illness / injury requiring diagnostic workup.      FINAL CLINICAL IMPRESSION(S) / ED DIAGNOSES   Final diagnoses:  Laceration of left hand, foreign body presence unspecified, initial encounter     Rx / DC Orders   ED Discharge Orders           Ordered    amoxicillin-clavulanate (AUGMENTIN) 875-125 MG tablet  2 times daily        09/16/24 0915             Note:  This document was prepared using Dragon voice recognition software and may include unintentional dictation errors.   Chiara Coltrin E, PA-C 09/16/24 1001    Viviann Pastor, MD 09/17/24 1321

## 2024-09-16 NOTE — Discharge Instructions (Addendum)
 You were evaluated in the emergency department for a laceration. It was repaired with sutures. Keep the area clean and dry.  Wash multiple times per day with soap and water. Take the antibiotics as prescribed. Do not go into the ocean or swimming pool.  Return to the emergency department or your primary care physician's office in 7 days for suture removal.  Return to the emergency department for:  -- Fever > 100.72F -- Increase pain in the wound -- Increase redness and swelling -- Pus coming from the wound -- Wound bleeds more than a small amount or it does not stop -- Wound edges come apart -- Severe pain -- Weakness or numbness in the affected area  Or any other new or worsening symptoms. It was a pleasure caring for you.

## 2024-09-16 NOTE — ED Triage Notes (Signed)
 Patient ambulatory to triage with steady gait, without difficulty or distress noted; pt reports her cat startled when she went to get OOB and he scratched her; jagged lac noted to base of left thumb with bleeding; gauze dressing applied to site

## 2024-09-16 NOTE — Telephone Encounter (Signed)
 ED note is in for today

## 2024-09-16 NOTE — Telephone Encounter (Signed)
 FYI Only or Action Required?: FYI only for provider: ED advised.  Patient was last seen in primary care on 08/19/2024 by Gretel App, NP.  Called Nurse Triage reporting Laceration.  Symptoms began today.  Interventions attempted: Other: pressure.  Symptoms are: unchanged.  Triage Disposition: Go to ED Now (Notify PCP)  Patient/caregiver understands and will follow disposition?: Yes  Copied from CRM #[id number]. Topic: Clinical - Red Word Triage >> Sep 16, 2024  1:20 PM Shereese L wrote: [facility] that prompted transfer to Nurse Triage: pet injured hand, hand filled up with blood, swollen with pain and thumb is black at the base Reason for Disposition  [1] Bleeding from wound AND [2] won't stop after 10 minutes of direct pressure (using correct technique)  Answer Assessment - Initial Assessment Questions Additional info: Patient called for emergent same day appointment. She received sutures in her hand this morning at ER around 5am. Short while ago she placed a glove on her hand to shower and when she removed the glove it was filled with blood. She thinks a suture fell out but is not sure. She is taking anticoagulant-Eliquis. Advised patient to return to ED, she refused as she had a bad experience their today. She plans on seeking treatment at walk in urgent care or may proceed to another ED. Patient was upset she could not be scheduled in clinic today.   1. LOCATION: Where are the sutures (or staples) located?      hand 2. NUMBER: How many sutures (or staples) are there?      ? 3. DATE IN: When were the sutures (or staples) put in?       today 4. DATE OUT: When did your doctor tell you the sutures (or staples) needed to come out?      5. OTHER SYMPTOMS: Do you have any other symptoms? (e.g., wound pain, discharge, fever?)     Actively bleeding, feels she has nerve damage 6. PREGNANCY: Is there any chance you are pregnant? When was your last menstrual  period?  Protocols used: Suture or Staple Questions-A-AH

## 2024-09-17 ENCOUNTER — Ambulatory Visit: Payer: Self-pay

## 2024-09-17 ENCOUNTER — Encounter

## 2024-09-17 NOTE — Telephone Encounter (Signed)
 FYI Only or Action Required?: FYI only for provider: pt going to urgent care today.  Patient was last seen in primary care on 08/19/2024 by Gretel App, NP.  Called Nurse Triage reporting Abrasion.  Symptoms began yesterday.  Interventions attempted: Prescription medications: amoxicillin.  Symptoms are: gradually worsening.  Triage Disposition: See HCP Within 4 Hours (Or PCP Triage)  Patient/caregiver understands and will follow disposition?: Yes                      Copied from CRM #[id number]. Topic: Clinical - Red Word Triage >> Sep 17, 2024  9:34 AM Ahlexyia S wrote: Red Word that prompted transfer to Nurse Triage: Pt called in stating that her cat scratched her. Pt stated that her hand is still extremely swollen, sore, red and black. Pt denied having any numbness or any other symptoms. Pt was triaged yesterday and was advised to go to ER and received stitches but pt is concerned that something else is wrong. Warm transferred to nurse triage. Reason for Disposition  [1] SEVERE pain (e.g., excruciating) AND [2] not improved 2 hours after pain medicine/ice packs  Answer Assessment - Initial Assessment Questions This RN recommends pt goes to UC as no appointment availability in office. Pt agreeable. This RN educated pt on new-worsening symptoms and when to call back/seek emergent care. Pt verbalized understanding and agrees to plan.    Cat scratch yesterday morning at 5 AM Pt's cat- up to date on vaccines Left hand base of index finger to base of thumb Black (new today),very swollen; pt does not want it to get worse Pain level 8-9/10 pain level Worse than yesterday 6 stitches yesterday at ED Denies fever, numbness Pt was started on amoxillin yesterday  Protocols used: Skin Injury-A-AH

## 2024-09-17 NOTE — Telephone Encounter (Signed)
 Called pt and she stated she did go to the urgent care and it was taken care of, pt let me know she needed an appt to get her stitches removed next week. I got pt scheduled with Dr. Watt at our sister location [facility] provided pt with their address as well as their phone number.

## 2024-09-19 ENCOUNTER — Ambulatory Visit: Admission: EM | Admit: 2024-09-19 | Discharge: 2024-09-19 | Disposition: A | Discharge status: Home / Self Care

## 2024-09-19 ENCOUNTER — Other Ambulatory Visit: Payer: Self-pay

## 2024-09-19 ENCOUNTER — Observation Stay

## 2024-09-19 ENCOUNTER — Inpatient Hospital Stay
Admission: EM | Admit: 2024-09-19 | Discharge: 2024-09-21 | DRG: 603 | Disposition: A | Discharge status: Home / Self Care | Attending: Student | Admitting: Student

## 2024-09-19 DIAGNOSIS — S60512D Abrasion of left hand, subsequent encounter: Secondary | ICD-10-CM | POA: Diagnosis not present

## 2024-09-19 DIAGNOSIS — K219 Gastro-esophageal reflux disease without esophagitis: Secondary | ICD-10-CM | POA: Diagnosis present

## 2024-09-19 DIAGNOSIS — S61412D Laceration without foreign body of left hand, subsequent encounter: Secondary | ICD-10-CM

## 2024-09-19 DIAGNOSIS — L03114 Cellulitis of left upper limb: Secondary | ICD-10-CM | POA: Diagnosis not present

## 2024-09-19 DIAGNOSIS — M170 Bilateral primary osteoarthritis of knee: Secondary | ICD-10-CM | POA: Diagnosis present

## 2024-09-19 DIAGNOSIS — W5503XD Scratched by cat, subsequent encounter: Secondary | ICD-10-CM

## 2024-09-19 DIAGNOSIS — S50812D Abrasion of left forearm, subsequent encounter: Secondary | ICD-10-CM

## 2024-09-19 DIAGNOSIS — L089 Local infection of the skin and subcutaneous tissue, unspecified: Principal | ICD-10-CM

## 2024-09-19 DIAGNOSIS — I482 Chronic atrial fibrillation, unspecified: Secondary | ICD-10-CM | POA: Diagnosis present

## 2024-09-19 LAB — COMPREHENSIVE METABOLIC PANEL WITH GFR
ALT: 13 U/L (ref 0–44)
AST: 22 U/L (ref 15–41)
Albumin: 4.4 g/dL (ref 3.5–5.0)
Alkaline Phosphatase: 81 U/L (ref 38–126)
Anion gap: 13 (ref 5–15)
BUN: 18 mg/dL (ref 8–23)
CO2: 26 mmol/L (ref 22–32)
Calcium: 9.6 mg/dL (ref 8.9–10.3)
Chloride: 104 mmol/L (ref 98–111)
Creatinine, Ser: 0.82 mg/dL (ref 0.44–1.00)
GFR, Estimated: >60 mL/min (ref >60)
Glucose, Bld: 91 mg/dL (ref 70–99)
Potassium: 4.3 mmol/L (ref 3.5–5.1)
Sodium: 142 mmol/L (ref 135–145)
Total Bilirubin: 0.9 mg/dL (ref 0.0–1.2)
Total Protein: 7 g/dL (ref 6.5–8.1)

## 2024-09-19 LAB — LACTIC ACID, PLASMA: Lactic Acid, Venous: 0.7 mmol/L (ref 0.5–1.9)

## 2024-09-19 LAB — CBC
HCT: 39.6 % (ref 36.0–46.0)
Hemoglobin: 13.3 g/dL (ref 12.0–15.0)
MCH: 32.3 pg (ref 26.0–34.0)
MCHC: 33.6 g/dL (ref 30.0–36.0)
MCV: 96.1 fL (ref 80.0–100.0)
Platelets: 184 K/uL (ref 150–400)
RBC: 4.12 MIL/uL (ref 3.87–5.11)
RDW: 13.1 % (ref 11.5–15.5)
WBC: 4.9 K/uL (ref 4.0–10.5)
nRBC: 0 % (ref 0.0–0.2)

## 2024-09-19 LAB — MAGNESIUM: Magnesium: 2 mg/dL (ref 1.7–2.4)

## 2024-09-19 MED ORDER — SODIUM CHLORIDE 0.9 % IV SOLN
3.0000 g | Freq: Once | INTRAVENOUS | Status: AC
  Administered 2024-09-19: 3 g via INTRAVENOUS
  Filled 2024-09-19: qty 8

## 2024-09-19 MED ORDER — ONDANSETRON HCL 4 MG PO TABS: 4.0000 mg | ORAL_TABLET | Freq: Four times a day (QID) | ORAL | Status: DC | PRN

## 2024-09-19 MED ORDER — PANTOPRAZOLE SODIUM 40 MG PO TBEC
40.0000 mg | DELAYED_RELEASE_TABLET | Freq: Every day | ORAL | Status: DC
  Administered 2024-09-20 – 2024-09-21 (×3): 40 mg via ORAL
  Filled 2024-09-19 (×3): qty 1

## 2024-09-19 MED ORDER — ACETAMINOPHEN 325 MG PO TABS
650.0000 mg | ORAL_TABLET | Freq: Once | ORAL | Status: AC
  Administered 2024-09-19: 650 mg via ORAL
  Filled 2024-09-19: qty 2

## 2024-09-19 MED ORDER — HYDROMORPHONE HCL 1 MG/ML IJ SOLN: 0.5000 mg | INTRAMUSCULAR | Status: DC | PRN

## 2024-09-19 MED ORDER — METOPROLOL SUCCINATE ER 25 MG PO TB24
25.0000 mg | ORAL_TABLET | Freq: Three times a day (TID) | ORAL | Status: DC
  Administered 2024-09-20 – 2024-09-21 (×4): 25 mg via ORAL
  Filled 2024-09-19 (×5): qty 1

## 2024-09-19 MED ORDER — GADOBUTROL 1 MMOL/ML IV SOLN
6.0000 mL | Freq: Once | INTRAVENOUS | Status: AC | PRN
  Administered 2024-09-19: 6 mL via INTRAVENOUS

## 2024-09-19 MED ORDER — SODIUM CHLORIDE 0.9 % IV SOLN
3.0000 g | Freq: Three times a day (TID) | INTRAVENOUS | Status: DC
  Administered 2024-09-20 – 2024-09-21 (×4): 3 g via INTRAVENOUS
  Filled 2024-09-19 (×7): qty 8

## 2024-09-19 MED ORDER — SODIUM CHLORIDE 0.9% FLUSH
3.0000 mL | Freq: Two times a day (BID) | INTRAVENOUS | Status: DC
  Administered 2024-09-19 – 2024-09-21 (×4): 3 mL via INTRAVENOUS

## 2024-09-19 MED ORDER — OXYCODONE HCL 5 MG PO TABS
5.0000 mg | ORAL_TABLET | ORAL | Status: DC | PRN
  Administered 2024-09-20: 5 mg via ORAL
  Filled 2024-09-19: qty 1

## 2024-09-19 MED ORDER — ACETAMINOPHEN 650 MG RE SUPP: 650.0000 mg | Freq: Four times a day (QID) | RECTAL | Status: DC | PRN

## 2024-09-19 MED ORDER — ONDANSETRON HCL 4 MG/2ML IJ SOLN: 4.0000 mg | Freq: Four times a day (QID) | INTRAMUSCULAR | Status: DC | PRN

## 2024-09-19 MED ORDER — SENNOSIDES-DOCUSATE SODIUM 8.6-50 MG PO TABS: 1.0000 | ORAL_TABLET | Freq: Every evening | ORAL | Status: DC | PRN

## 2024-09-19 MED ORDER — ACETAMINOPHEN 325 MG PO TABS
650.0000 mg | ORAL_TABLET | Freq: Four times a day (QID) | ORAL | Status: DC | PRN
  Administered 2024-09-20 – 2024-09-21 (×3): 650 mg via ORAL
  Filled 2024-09-19 (×3): qty 2

## 2024-09-19 NOTE — H&P (Signed)
 History and Physical    BRANDILEE PIES FMW:[id number] DOB: 1940/12/19 DOA: 09/19/2024  DOS: the patient was seen and examined on 09/19/2024  PCP: Cynthia App, NP   Patient coming from: Home  I have personally briefly reviewed patient's old medical records in [facility] and CareEverywhere  HPI:   Cynthia Potts is a 83 y.o. year old female with medical history of chronic atrial fibrillation, osteoarthritis, GERD presenting to the ED after having a an infection from a cat bite that did not respond to oral antibiotics.  Patient reports her cat bit her about 3 days ago and she presented to the ED and was discharged with oral antibiotics after laceration repair was done.  Her hand has continued to be in pain and there has been some redness that has increased.  She reports this was her.  She denies any fevers or chills.   On arrival to the ED patient was noted to be HDS stable.  Lab work obtained.  CBC without leukocytosis or anemia.  CMP unremarkable.  Lactic acid normal.  Given failure of outpatient antibiotics, IV antibiotics started by EDP and [facility] contacted for admission.  Review of Systems: As mentioned in the history of present illness. All other systems reviewed and are negative.   Past Medical History:  Diagnosis Date   Arthritis    Biatrial enlargement    GERD (gastroesophageal reflux disease)    HOH (hard of hearing)    Bilateral Hearing Aids   Moderate mitral regurgitation    Moderate tricuspid regurgitation    Persistent atrial fibrillation (HCC)    Psoriasis    Psoriatic arthritis (HCC)    RLS (restless legs syndrome)     Past Surgical History:  Procedure Laterality Date   CARDIAC CATHETERIZATION N/A 06/06/2015   Procedure: Right Heart Cath;  Surgeon: Ezra GORMAN Shuck, MD;  Location: [facility] INVASIVE CV LAB;  Service: Cardiovascular;  Laterality: N/A;   CATARACT EXTRACTION W/PHACO Left 08/23/2015   Procedure: CATARACT EXTRACTION PHACO AND INTRAOCULAR LENS  PLACEMENT (IOC);  Surgeon: Elsie Carmine, MD;  Location: [facility] ORS;  Service: Ophthalmology;  Laterality: Left;  US  01:07 AP% 22.9 CDE 15.42 fluid pack lot #[id number]   CATARACT EXTRACTION W/PHACO Right 11/22/2015   Procedure: CATARACT EXTRACTION PHACO AND INTRAOCULAR LENS PLACEMENT (IOC);  Surgeon: Elsie Carmine, MD;  Location: [facility] ORS;  Service: Ophthalmology;  Laterality: Right;  US   0:58.2 AP%  15.2 CDE   8.98 fluid pack lot # [id number]   CYSTOSCOPY     ELECTROPHYSIOLOGIC STUDY N/A 02/28/2015   Procedure: Cardioversion;  Surgeon: Wolm JINNY Rhyme, MD;  Location: [facility] ORS;  Service: Cardiovascular;  Laterality: N/A;   ELECTROPHYSIOLOGIC STUDY N/A 03/24/2015   Procedure: Cardioversion;  Surgeon: Wolm JINNY Rhyme, MD;  Location: [facility] ORS;  Service: Cardiovascular;  Laterality: N/A;   EYE SURGERY     KNEE ARTHROSCOPY Left 05/08/2017   Procedure: ARTHROSCOPY KNEE, Tear of the posterior horn of medical meniscus, Partial medical meniscectomy;  Surgeon: Mardee Lynwood SQUIBB, MD;  Location: [facility] ORS;  Service: Orthopedics;  Laterality: Left;   none     TEE WITHOUT CARDIOVERSION N/A 05/12/2015   Procedure: TRANSESOPHAGEAL ECHOCARDIOGRAM (TEE);  Surgeon: Maude JAYSON Emmer, MD;  Location: [facility] ENDOSCOPY;  Service: Cardiovascular;  Laterality: N/A;   TUBAL LIGATION       Allergies[1]  Family History  Problem Relation Age of Onset   Diabetes Mother    Stroke Father 64    Prior to Admission medications  Medication Sig  Start Date End Date Taking? Authorizing Provider  apixaban  (ELIQUIS ) 5 MG TABS tablet Take 5 mg by mouth 2 (two) times daily.  07/02/16  Yes [provider]  Coenzyme Q10 (CO Q-10) 300 MG CAPS Take 300 mg by mouth daily.   Yes [provider]  furosemide  (LASIX ) 20 MG tablet Take 20 mg by mouth. 08/07/24 08/07/25 Yes [provider]  metoprolol  succinate (TOPROL -XL) 25 MG 24 hr tablet Take 1 tablet by mouth 3 (three) times daily. I tab in the AM, and 2 at bedtime  per pt. 01/17/23  Yes [provider]  Multiple Vitamins-Minerals (ONE-A-DAY WOMENS 50+ ADVANTAGE PO) Take 1 tablet by mouth daily.   Yes [provider]  omeprazole  (PRILOSEC) 20 MG capsule Take 1 capsule (20 mg total) by mouth daily. 01/22/24  Yes Cynthia App, NP  amoxicillin -clavulanate (AUGMENTIN ) 875-125 MG tablet Take 1 tablet by mouth 2 (two) times daily for 7 days. Patient not taking: Reported on 09/19/2024 09/16/24 09/23/24  Poggi, Jenna E, PA-C  Cholecalciferol (EQL VITAMIN D3) 2000 units CAPS Take 2,000 Units by mouth daily.    [provider]  clobetasol  (TEMOVATE ) 0.05 % external solution as needed. Patient not taking: Reported on 09/19/2024 01/28/18   [provider]  magnesium oxide (MAG-OX) 400 MG tablet Take 400 mg by mouth daily. Patient not taking: Reported on 09/19/2024    [provider]    Social History:  reports that she quit smoking about 41 years ago. Her smoking use included cigarettes. She started smoking about 71 years ago. She has a 30 pack-year smoking history. She has never used smokeless tobacco. She reports current alcohol use of about 1.0 standard drink of alcohol per week. She reports that she does not use drugs.    Physical Exam: Vitals:   09/19/24 1657 09/19/24 1658  BP:  (!) 171/89  Pulse:  (!) 107  Resp:  16  Temp:  97.6 F (36.4 C)  TempSrc:  Oral  SpO2:  95%  Weight: 63.5 kg   Height: 5' 3 (1.6 m)     Gen: NAD HENT: NCAT CV: Irregularly irregular, normal rate, good pulses Lung: CTAB Abd: No TTP, normal bowel sounds MSK: good bulk and tone, left hand with laceration and redness and erythema around this.  There is TTP of the area, no drainage noted.  See media tab. Neuro: alert and oriented   Labs on Admission: I have personally reviewed following labs and imaging studies  CBC: Recent Labs  Lab 09/19/24 1701  WBC 4.9  HGB 13.3  HCT 39.6  MCV 96.1  PLT 184   Basic Metabolic  Panel: Recent Labs  Lab 09/19/24 1701  NA 142  K 4.3  CL 104  CO2 26  GLUCOSE 91  BUN 18  CREATININE 0.82  CALCIUM 9.6   GFR: Estimated Creatinine Clearance: 48.2 mL/min (by C-G formula based on SCr of 0.82 mg/dL). Liver Function Tests: Recent Labs  Lab 09/19/24 1701  AST 22  ALT 13  ALKPHOS 81  BILITOT 0.9  PROT 7.0  ALBUMIN 4.4   No results for input(s): LIPASE, AMYLASE in the last 168 hours. No results for input(s): AMMONIA in the last 168 hours. Coagulation Profile: No results for input(s): INR, PROTIME in the last 168 hours. Cardiac Enzymes: No results for input(s): CKTOTAL, CKMB, CKMBINDEX, TROPONINI, TROPONINIHS in the last 168 hours. BNP (last 3 results) No results for input(s): BNP in the last 8760 hours. HbA1C: No results for input(s): HGBA1C  in the last 72 hours. CBG: No results for input(s): GLUCAP in the last 168 hours. Lipid Profile: No results for input(s): CHOL, HDL, LDLCALC, TRIG, CHOLHDL, LDLDIRECT in the last 72 hours. Thyroid  Function Tests: No results for input(s): TSH, T4TOTAL, FREET4, T3FREE, THYROIDAB in the last 72 hours. Anemia Panel: No results for input(s): VITAMINB12, FOLATE, FERRITIN, TIBC, IRON, RETICCTPCT in the last 72 hours. Urine analysis:    Component Value Date/Time   COLORURINE AMBER BIOCHEMICALS MAY BE AFFECTED BY COLOR (A) 12/21/2010 1126   APPEARANCEUR CLOUDY (A) 12/21/2010 1126   LABSPEC 1.029 12/21/2010 1126   PHURINE 6.0 12/21/2010 1126   GLUCOSEU NEGATIVE 12/21/2010 1126   HGBUR NEGATIVE 12/21/2010 1126   BILIRUBINUR SMALL (A) 12/21/2010 1126   KETONESUR 15 (A) 12/21/2010 1126   PROTEINUR NEGATIVE 12/21/2010 1126   UROBILINOGEN 0.2 12/21/2010 1126   NITRITE NEGATIVE 12/21/2010 1126   LEUKOCYTESUR SMALL (A) 12/21/2010 1126    Radiological Exams on Admission: I have personally reviewed images No results found.  EKG: My personal interpretation of EKG  shows: Pending  Assessment/Plan Principal Problem:   Cellulitis of hand, left Active Problems:   Chronic atrial fibrillation (HCC)   Osteoarthritis of both knees   Gastroesophageal reflux disease Patient with cellulitis of the left hand after a cat bite.  She has failed outpatient antibiotics.  She is admitted for IV antibiotics.  Given her worsening on oral antibiotics and significant TTP, will get MRI of the hand to look for an abscess.  If present we will consult hand surgery.  Will follow-up blood cultures, monitor leukocyte count and fever curve.  Chronic atrial fibrillation: Will continue her metoprolol  and hold Eliquis  in case she needs any surgical intervention this admission.  Elevated blood pressure: May be  secondary to pain as she does not report blood pressure history.  Will use IV labetalol as needed.  GERD: Not on any medication per the patient.  Will continue to monitor.   VTE prophylaxis:  SCDs  Diet: Regular Code Status:  DNR with Intubation Telemetry:  Admission status: Observation, Telemetry bed Patient is from: Home Anticipated d/c is to: Home Anticipated d/c is in: 1-2 days   Family Communication: Updated at bedside  Consults called: None   Severity of Illness: The appropriate patient status for this patient is OBSERVATION. Observation status is judged to be reasonable and necessary in order to provide the required intensity of service to ensure the patient's safety. The patient's presenting symptoms, physical exam findings, and initial radiographic and laboratory data in the context of their medical condition is felt to place them at decreased risk for further clinical deterioration. Furthermore, it is anticipated that the patient will be medically stable for discharge from the hospital within 2 midnights of admission.    Morene Bathe, MD [facility]ol[facility] DEL. [facility]     [1] No Known Allergies

## 2024-09-19 NOTE — ED Provider Notes (Signed)
 [name]    CSN: [id number] Arrival date & time: 09/19/24  1447      History   Chief Complaint Chief Complaint  Patient presents with   Wound Check    HPI Cynthia Potts is a 83 y.o. female.  Patient presents with concern for infection at the site of a cat scratch on her left hand.  Her own cat got startled and accidentally scratched her hand 3 days ago.  She was seen in the ED at that time.  Since then, patient has noted increased redness and wound drainage.  Today she noted that the redness has spread to her left forearm.  No fever or chills.  Patient was seen at [facility] ED on 09/16/2024; diagnosed with laceration of left hand; 6 sutures; tetanus updated; discharged on Augmentin .  The history is provided by the patient and medical records.    Past Medical History:  Diagnosis Date   Arthritis    Biatrial enlargement    GERD (gastroesophageal reflux disease)    HOH (hard of hearing)    Bilateral Hearing Aids   Moderate mitral regurgitation    Moderate tricuspid regurgitation    Persistent atrial fibrillation (HCC)    Psoriasis    Psoriatic arthritis (HCC)    RLS (restless legs syndrome)     Patient Active Problem List   Diagnosis Date Noted   Elevated glucose 08/19/2024   Pedal edema 08/19/2024   Chronic atrial fibrillation (HCC) 01/22/2024   Vitamin D  deficiency 01/22/2024   Osteoarthritis of both knees 01/22/2024   Gastroesophageal reflux disease 01/22/2024   Hx of psoriatic arthritis 01/22/2024   Chest pain 09/12/2015   Moderate to severe pulmonary hypertension (HCC) 06/03/2015   Moderate tricuspid insufficiency 06/03/2015   Atrial flutter with controlled response (HCC) 03/24/2015   Moderate mitral insufficiency 02/11/2015    Past Surgical History:  Procedure Laterality Date   CARDIAC CATHETERIZATION N/A 06/06/2015   Procedure: Right Heart Cath;  Surgeon: Ezra GORMAN Shuck, MD;  Location: [facility] INVASIVE CV LAB;  Service: Cardiovascular;   Laterality: N/A;   CATARACT EXTRACTION W/PHACO Left 08/23/2015   Procedure: CATARACT EXTRACTION PHACO AND INTRAOCULAR LENS PLACEMENT (IOC);  Surgeon: Elsie Carmine, MD;  Location: [facility] ORS;  Service: Ophthalmology;  Laterality: Left;  US  01:07 AP% 22.9 CDE 15.42 fluid pack lot #[id number]   CATARACT EXTRACTION W/PHACO Right 11/22/2015   Procedure: CATARACT EXTRACTION PHACO AND INTRAOCULAR LENS PLACEMENT (IOC);  Surgeon: Elsie Carmine, MD;  Location: [facility] ORS;  Service: Ophthalmology;  Laterality: Right;  US   0:58.2 AP%  15.2 CDE   8.98 fluid pack lot # [id number]   CYSTOSCOPY     ELECTROPHYSIOLOGIC STUDY N/A 02/28/2015   Procedure: Cardioversion;  Surgeon: Wolm JINNY Rhyme, MD;  Location: [facility] ORS;  Service: Cardiovascular;  Laterality: N/A;   ELECTROPHYSIOLOGIC STUDY N/A 03/24/2015   Procedure: Cardioversion;  Surgeon: Wolm JINNY Rhyme, MD;  Location: [facility] ORS;  Service: Cardiovascular;  Laterality: N/A;   EYE SURGERY     KNEE ARTHROSCOPY Left 05/08/2017   Procedure: ARTHROSCOPY KNEE, Tear of the posterior horn of medical meniscus, Partial medical meniscectomy;  Surgeon: Mardee Lynwood SQUIBB, MD;  Location: [facility] ORS;  Service: Orthopedics;  Laterality: Left;   none     TEE WITHOUT CARDIOVERSION N/A 05/12/2015   Procedure: TRANSESOPHAGEAL ECHOCARDIOGRAM (TEE);  Surgeon: Maude JAYSON Emmer, MD;  Location: [facility] ENDOSCOPY;  Service: Cardiovascular;  Laterality: N/A;   TUBAL LIGATION      OB History   No obstetric history on  file.      Home Medications    Prior to Admission medications  Medication Sig Start Date End Date Taking? Authorizing Provider  amoxicillin -clavulanate (AUGMENTIN ) 875-125 MG tablet Take 1 tablet by mouth 2 (two) times daily for 7 days. 09/16/24 09/23/24  Poggi, Jenna E, PA-C  apixaban  (ELIQUIS ) 5 MG TABS tablet Take 5 mg by mouth 2 (two) times daily.  07/02/16   [provider]  Cholecalciferol (EQL VITAMIN D3) 2000 units CAPS Take 2,000 Units by mouth daily.     [provider]  clobetasol  (TEMOVATE ) 0.05 % external solution as needed. Patient not taking: Reported on 09/19/2024 01/28/18   [provider]  Coenzyme Q10 (CO Q-10) 300 MG CAPS Take 300 mg by mouth daily.    [provider]  furosemide  (LASIX ) 20 MG tablet Take 20 mg by mouth. 08/07/24 08/07/25  [provider]  magnesium oxide (MAG-OX) 400 MG tablet Take 400 mg by mouth daily.    [provider]  metoprolol  succinate (TOPROL -XL) 25 MG 24 hr tablet Take 1 tablet by mouth 3 (three) times daily. 01/17/23   [provider]  Multiple Vitamins-Minerals (ONE-A-DAY WOMENS 50+ ADVANTAGE PO) Take 1 tablet by mouth daily.    [provider]  omeprazole  (PRILOSEC) 20 MG capsule Take 1 capsule (20 mg total) by mouth daily. 01/22/24   Gretel App, NP    Family History Family History  Problem Relation Age of Onset   Diabetes Mother    Stroke Father 27    Social History Social History[1]   Allergies   Patient has no known allergies.   Review of Systems Review of Systems  Constitutional:  Negative for chills and fever.  Musculoskeletal:  Positive for arthralgias and joint swelling.  Skin:  Positive for color change and wound.  Neurological:  Negative for weakness and numbness.     Physical Exam Triage Vital Signs ED Triage Vitals  Encounter Vitals Group     BP      Girls Systolic BP Percentile      Girls Diastolic BP Percentile      Boys Systolic BP Percentile      Boys Diastolic BP Percentile      Pulse      Resp      Temp      Temp src      SpO2      Weight      Height      Head Circumference      Peak Flow      Pain Score      Pain Loc      Pain Education      Exclude from Growth Chart    No data found.  Updated Vital Signs BP 139/85   Pulse 67   Temp 97.9 F (36.6 C)   Resp 18   SpO2 96%   Visual Acuity Right Eye Distance:   Left Eye Distance:   Bilateral Distance:    Right Eye Near:   Left  Eye Near:    Bilateral Near:     Physical Exam Constitutional:      General: She is not in acute distress. Musculoskeletal:        General: Swelling and tenderness present.  Skin:    General: Skin is warm and dry.     Findings: Erythema and lesion present.     Comments: Sutured wound on left hand with surrounding erythema and edema.  Erythema noted to mid forearm.  See pictures.  Neurological:     General: No focal deficit present.     Mental Status: She is alert.     Sensory: No sensory deficit.     Motor: No weakness.         UC Treatments / Results  Labs (all labs ordered are listed, but only abnormal results are displayed) Labs Reviewed - No data to display  EKG   Radiology No results found.  Procedures Procedures (including critical care time)  Medications Ordered in UC Medications - No data to display  Initial Impression / Assessment and Plan / UC Course  I have reviewed the triage vital signs and the nursing notes.  Pertinent labs & imaging results that were available during my care of the patient were reviewed by me and considered in my medical decision making (see chart for details).    Cellulitis of left hand and forearm, laceration of left hand due to cat scratch.  Afebrile and vital signs are stable.  Sending patient to the ED for evaluation as I am concerned that she may need IV antibiotics and blood work.  She is agreeable to this and will go to [facility] ED now.  Final Clinical Impressions(s) / UC Diagnoses   Final diagnoses:  Cellulitis of left hand  Cellulitis of left forearm  Laceration of left hand without foreign body, subsequent encounter  Cat scratch of left hand, subsequent encounter     Discharge Instructions      Go to the emergency department for evaluation of the infection in your left hand with redness going up your left arm.     ED Prescriptions   None    PDMP not reviewed this encounter.    [1]  Social  History Tobacco Use   Smoking status: Former    Current packs/day: 0.00    Average packs/day: 1 pack/day for 30.0 years (30.0 ttl pk-yrs)    Types: Cigarettes    Start date: 07/08/1953    Quit date: 07/09/1983    Years since quitting: 41.2   Smokeless tobacco: Never  Vaping Use   Vaping status: Never Used  Substance Use Topics   Alcohol use: Yes    Alcohol/week: 1.0 standard drink of alcohol    Types: 1 Glasses of wine per week    Comment: daily   Drug use: No     Corlis Burnard DEL, NP 09/19/24 1550

## 2024-09-19 NOTE — Discharge Instructions (Signed)
 Go to the emergency department for evaluation of the infection in your left hand with redness going up your left arm.

## 2024-09-19 NOTE — ED Notes (Addendum)
 Patient is being discharged from the Urgent Care and sent to the Emergency Department via POV . Per NP Corlis, patient is in need of higher level of care due to cellulitis of L hand. Patient is aware and verbalizes understanding of plan of care.  Vitals:   09/19/24 1523  BP: 139/85  Pulse: 67  Resp: 18  Temp: 97.9 F (36.6 C)  SpO2: 96%

## 2024-09-19 NOTE — ED Triage Notes (Signed)
 Pt reports Wednesday her cat scratched her and was seen by MD and started of antibiotics. Redness has spread to her left forearm. Pt reports tenderness to touch.

## 2024-09-19 NOTE — ED Triage Notes (Signed)
 Pt being seen in UC for wound check to L hand. Pt reports having stitches placed 3 days ago, pt concerned that site is infected. Pt denies known fevers. Pt reports some drainage, redness, and coldness to area.

## 2024-09-19 NOTE — ED Notes (Signed)
 Informed Cynthia Potts of assigned bed

## 2024-09-19 NOTE — ED Provider Notes (Signed)
 [facility] Provider Note    Event Date/Time   First MD Initiated Contact with Patient 09/19/24 1723     (approximate)   History   No chief complaint on file.   HPI  Cynthia Potts is a 83 y.o. female presenting to the emergency department complaining of pain and swelling to her left arm.  Patient reports that on Wednesday she had a scratch from her cat to the left hand and went to her primary care doctor who prescribed her Augmentin .  She reports that she went back to her doctor today because she noted that redness and swelling was extending up her forearm and she was told to come immediately to the emergency department.  She denies any fevers, sweats, or chills.     Physical Exam   Triage Vital Signs: ED Triage Vitals  Encounter Vitals Group     BP 09/19/24 1658 (!) 171/89     Girls Systolic BP Percentile --      Girls Diastolic BP Percentile --      Boys Systolic BP Percentile --      Boys Diastolic BP Percentile --      Pulse Rate 09/19/24 1658 (!) 107     Resp 09/19/24 1658 16     Temp 09/19/24 1658 97.6 F (36.4 C)     Temp Source 09/19/24 1658 Oral     SpO2 09/19/24 1658 95 %     Weight 09/19/24 1657 140 lb (63.5 kg)     Height 09/19/24 1657 5' 3 (1.6 m)     Head Circumference --      Peak Flow --      Pain Score 09/19/24 1657 8     Pain Loc --      Pain Education --      Exclude from Growth Chart --     Most recent vital signs: Vitals:   09/19/24 1658  BP: (!) 171/89  Pulse: (!) 107  Resp: 16  Temp: 97.6 F (36.4 C)  SpO2: 95%     General: Awake, no distress.  CV:  Good peripheral perfusion.  Resp:  Normal effort.  Abd:  No distention.  Other:  Sutured laceration to the dorsal surface of the left hand with surrounding erythema and edema, erythema streaking to midway up the left forearm   ED Results / Procedures / Treatments   Labs (all labs ordered are listed, but only abnormal results are displayed) Labs  Reviewed  CULTURE, BLOOD (ROUTINE X 2)  CULTURE, BLOOD (ROUTINE X 2)  CBC  COMPREHENSIVE METABOLIC PANEL WITH GFR  LACTIC ACID, PLASMA     EKG     RADIOLOGY     PROCEDURES:  Critical Care performed: No  Procedures   MEDICATIONS ORDERED IN ED: Medications  Ampicillin -Sulbactam (UNASYN ) 3 g in sodium chloride  0.9 % 100 mL IVPB (3 g Intravenous New Bag/Given 09/19/24 1813)  acetaminophen  (TYLENOL ) tablet 650 mg (650 mg Oral Given 09/19/24 1812)     IMPRESSION / MDM / ASSESSMENT AND PLAN / ED COURSE  I reviewed the triage vital signs and the nursing notes.                              Differential diagnosis includes, but is not limited to, failed outpatient antibiotics, cellulitis  Patient's presentation is most consistent with acute presentation with potential threat to life or bodily function.  Patient is an 83 year old  female presenting to the emergency department with worsening erythema and swelling to her left arm where she received a cat scratch a few days ago.  Patient has been on Augmentin  but symptoms have continued to worsen.  I discussed with the patient that I would like to admit her for IV antibiotics and she is agreeable.  Patient started on Unasyn .  I discussed patient's case with hospitalist who is in agreement with plan for admission.      FINAL CLINICAL IMPRESSION(S) / ED DIAGNOSES   Final diagnoses:  Wound infection  Cat scratch of left forearm, subsequent encounter     Rx / DC Orders   ED Discharge Orders     None        Note:  This document was prepared using Dragon voice recognition software and may include unintentional dictation errors.   Kylar Speelman M, MD 09/20/24 [phone]

## 2024-09-19 NOTE — ED Provider Notes (Incomplete)
° °  [facility] Provider Note    Event Date/Time   First MD Initiated Contact with Patient 09/19/24 1723     (approximate)   History   No chief complaint on file.   HPI  Cynthia Potts is a 83 y.o. female  ***       Physical Exam   Triage Vital Signs: ED Triage Vitals  Encounter Vitals Group     BP 09/19/24 1658 (!) 171/89     Girls Systolic BP Percentile --      Girls Diastolic BP Percentile --      Boys Systolic BP Percentile --      Boys Diastolic BP Percentile --      Pulse Rate 09/19/24 1658 (!) 107     Resp 09/19/24 1658 16     Temp 09/19/24 1658 97.6 F (36.4 C)     Temp Source 09/19/24 1658 Oral     SpO2 09/19/24 1658 95 %     Weight 09/19/24 1657 140 lb (63.5 kg)     Height 09/19/24 1657 5' 3 (1.6 m)     Head Circumference --      Peak Flow --      Pain Score 09/19/24 1657 8     Pain Loc --      Pain Education --      Exclude from Growth Chart --     Most recent vital signs: Vitals:   09/19/24 1658  BP: (!) 171/89  Pulse: (!) 107  Resp: 16  Temp: 97.6 F (36.4 C)  SpO2: 95%     General: Awake, no distress. *** CV:  Good peripheral perfusion. *** Resp:  Normal effort. *** Abd:  No distention. *** Other:  ***   ED Results / Procedures / Treatments   Labs (all labs ordered are listed, but only abnormal results are displayed) Labs Reviewed  CBC  COMPREHENSIVE METABOLIC PANEL WITH GFR  LACTIC ACID, PLASMA     EKG  ***   RADIOLOGY ***    PROCEDURES:  Critical Care performed: {CriticalCareYesNo:19197::Yes, see critical care procedure note(s),No}  Procedures   MEDICATIONS ORDERED IN ED: Medications  acetaminophen  (TYLENOL ) tablet 650 mg (has no administration in time range)     IMPRESSION / MDM / ASSESSMENT AND PLAN / ED COURSE  I reviewed the triage vital signs and the nursing notes.                              Differential diagnosis includes, but is not limited to, ***  Patient's  presentation is most consistent with {EM COPA:27473}  ***      FINAL CLINICAL IMPRESSION(S) / ED DIAGNOSES   Final diagnoses:  None     Rx / DC Orders   ED Discharge Orders     None        Note:  This document was prepared using Dragon voice recognition software and may include unintentional dictation errors.

## 2024-09-20 ENCOUNTER — Encounter: Payer: Self-pay | Admitting: Internal Medicine

## 2024-09-20 DIAGNOSIS — R030 Elevated blood-pressure reading, without diagnosis of hypertension: Secondary | ICD-10-CM | POA: Diagnosis present

## 2024-09-20 DIAGNOSIS — K219 Gastro-esophageal reflux disease without esophagitis: Secondary | ICD-10-CM | POA: Diagnosis present

## 2024-09-20 DIAGNOSIS — T148XXA Other injury of unspecified body region, initial encounter: Secondary | ICD-10-CM | POA: Diagnosis present

## 2024-09-20 DIAGNOSIS — L03114 Cellulitis of left upper limb: Secondary | ICD-10-CM | POA: Diagnosis present

## 2024-09-20 DIAGNOSIS — Z79899 Other long term (current) drug therapy: Secondary | ICD-10-CM | POA: Diagnosis not present

## 2024-09-20 DIAGNOSIS — G2581 Restless legs syndrome: Secondary | ICD-10-CM | POA: Diagnosis present

## 2024-09-20 DIAGNOSIS — Z974 Presence of external hearing-aid: Secondary | ICD-10-CM | POA: Diagnosis not present

## 2024-09-20 DIAGNOSIS — Z833 Family history of diabetes mellitus: Secondary | ICD-10-CM | POA: Diagnosis not present

## 2024-09-20 DIAGNOSIS — W5501XA Bitten by cat, initial encounter: Secondary | ICD-10-CM | POA: Diagnosis not present

## 2024-09-20 DIAGNOSIS — H9190 Unspecified hearing loss, unspecified ear: Secondary | ICD-10-CM | POA: Diagnosis present

## 2024-09-20 DIAGNOSIS — Z7901 Long term (current) use of anticoagulants: Secondary | ICD-10-CM | POA: Diagnosis not present

## 2024-09-20 DIAGNOSIS — L4050 Arthropathic psoriasis, unspecified: Secondary | ICD-10-CM | POA: Diagnosis present

## 2024-09-20 DIAGNOSIS — L03113 Cellulitis of right upper limb: Secondary | ICD-10-CM | POA: Diagnosis present

## 2024-09-20 DIAGNOSIS — I4819 Other persistent atrial fibrillation: Secondary | ICD-10-CM | POA: Diagnosis present

## 2024-09-20 DIAGNOSIS — I081 Rheumatic disorders of both mitral and tricuspid valves: Secondary | ICD-10-CM | POA: Diagnosis present

## 2024-09-20 DIAGNOSIS — Z961 Presence of intraocular lens: Secondary | ICD-10-CM | POA: Diagnosis present

## 2024-09-20 DIAGNOSIS — Z87891 Personal history of nicotine dependence: Secondary | ICD-10-CM | POA: Diagnosis not present

## 2024-09-20 DIAGNOSIS — Z66 Do not resuscitate: Secondary | ICD-10-CM | POA: Diagnosis present

## 2024-09-20 LAB — CBC
HCT: 37.3 % (ref 36.0–46.0)
Hemoglobin: 12.6 g/dL (ref 12.0–15.0)
MCH: 32.1 pg (ref 26.0–34.0)
MCHC: 33.8 g/dL (ref 30.0–36.0)
MCV: 95.2 fL (ref 80.0–100.0)
Platelets: 176 K/uL (ref 150–400)
RBC: 3.92 MIL/uL (ref 3.87–5.11)
RDW: 13 % (ref 11.5–15.5)
WBC: 4 K/uL (ref 4.0–10.5)
nRBC: 0 % (ref 0.0–0.2)

## 2024-09-20 LAB — BASIC METABOLIC PANEL WITH GFR
Anion gap: 12 (ref 5–15)
BUN: 16 mg/dL (ref 8–23)
CO2: 26 mmol/L (ref 22–32)
Calcium: 8.6 mg/dL — ABNORMAL LOW (ref 8.9–10.3)
Chloride: 106 mmol/L (ref 98–111)
Creatinine, Ser: 0.75 mg/dL (ref 0.44–1.00)
GFR, Estimated: >60 mL/min (ref >60)
Glucose, Bld: 87 mg/dL (ref 70–99)
Potassium: 3.8 mmol/L (ref 3.5–5.1)
Sodium: 144 mmol/L (ref 135–145)

## 2024-09-20 MED ORDER — APIXABAN 5 MG PO TABS
5.0000 mg | ORAL_TABLET | Freq: Two times a day (BID) | ORAL | Status: DC
  Administered 2024-09-20 – 2024-09-21 (×3): 5 mg via ORAL
  Filled 2024-09-20 (×3): qty 1

## 2024-09-20 NOTE — Hospital Course (Addendum)
 83 y.o. female with a past medical history of atrial flutter/atrial fibrillation on Eliquis  who presented 12/10 to the ED for evaluation of left hand laceration after her cat scratched her.   She had her laceration repaired and was discharged on augmentin  (12/10) .   Since that time she developed worsening pain and erythema  she noted redness up to her forearm. She also stated she had some purulent drainage. An MRI of the L hand was obtained this hospitalization  which showed no evidence of abscess but possible myositis of muscle between 1st and second metacarpals.   She has been afebrile and has no leukocytosis. Her last eliquis  dose was last night (11/13).

## 2024-09-20 NOTE — Consult Note (Signed)
 ORTHOPAEDIC CONSULTATION  REQUESTING PHYSICIAN: Franchot Novel, MD  Chief Complaint:   L hand pain  History of Present Illness: Cynthia Potts is a 83 y.o. female with a past medical history significant for atrial fibrillation on Eliquis  and GERD who presented to urgent care facility yesterday who recommended coming to the emergency department for IV antibiotics.  Patient is right-handed.  She states that approximately 4 days ago, her cat was startled and accidentally scratched her on the dorsum of the left hand in the first webspace.  She presented to emergency department at that time and underwent bedside I&D and closure of wound with nylon sutures.  She is discharged on Augmentin , but had increased wound drainage as well as erythema streaking up the forearm, prompting evaluation yesterday.  Patient states that since admission, the forearm erythema has significantly improved.  She has not had active drainage.  She is still quite tender about the laceration.  She lives at home with her husband.  She is a retired engineer, civil (consulting).  Past Medical History:  Diagnosis Date   Arthritis    Biatrial enlargement    GERD (gastroesophageal reflux disease)    HOH (hard of hearing)    Bilateral Hearing Aids   Moderate mitral regurgitation    Moderate tricuspid regurgitation    Persistent atrial fibrillation (HCC)    Psoriasis    Psoriatic arthritis (HCC)    RLS (restless legs syndrome)    Past Surgical History:  Procedure Laterality Date   CARDIAC CATHETERIZATION N/A 06/06/2015   Procedure: Right Heart Cath;  Surgeon: Ezra GORMAN Shuck, MD;  Location: [facility] INVASIVE CV LAB;  Service: Cardiovascular;  Laterality: N/A;   CATARACT EXTRACTION W/PHACO Left 08/23/2015   Procedure: CATARACT EXTRACTION PHACO AND INTRAOCULAR LENS PLACEMENT (IOC);  Surgeon: Elsie Carmine, MD;  Location: [facility] ORS;  Service: Ophthalmology;  Laterality: Left;  US   01:07 AP% 22.9 CDE 15.42 fluid pack lot #[id number]   CATARACT EXTRACTION W/PHACO Right 11/22/2015   Procedure: CATARACT EXTRACTION PHACO AND INTRAOCULAR LENS PLACEMENT (IOC);  Surgeon: Elsie Carmine, MD;  Location: [facility] ORS;  Service: Ophthalmology;  Laterality: Right;  US   0:58.2 AP%  15.2 CDE   8.98 fluid pack lot # [id number]   CYSTOSCOPY     ELECTROPHYSIOLOGIC STUDY N/A 02/28/2015   Procedure: Cardioversion;  Surgeon: Wolm JINNY Rhyme, MD;  Location: [facility] ORS;  Service: Cardiovascular;  Laterality: N/A;   ELECTROPHYSIOLOGIC STUDY N/A 03/24/2015   Procedure: Cardioversion;  Surgeon: Wolm JINNY Rhyme, MD;  Location: [facility] ORS;  Service: Cardiovascular;  Laterality: N/A;   EYE SURGERY     KNEE ARTHROSCOPY Left 05/08/2017   Procedure: ARTHROSCOPY KNEE, Tear of the posterior horn of medical meniscus, Partial medical meniscectomy;  Surgeon: Mardee Lynwood SQUIBB, MD;  Location: [facility] ORS;  Service: Orthopedics;  Laterality: Left;   none     TEE WITHOUT CARDIOVERSION N/A 05/12/2015   Procedure: TRANSESOPHAGEAL ECHOCARDIOGRAM (TEE);  Surgeon: Maude JAYSON Emmer, MD;  Location: [facility] ENDOSCOPY;  Service: Cardiovascular;  Laterality: N/A;   TUBAL LIGATION     Social History   Socioeconomic History   Marital status: Married    Spouse name: Not on file   Number of children: Not on file   Years of education: Not on file   Highest education level: Not on file  Occupational History   Not on file  Tobacco Use   Smoking status: Former    Current packs/day: 0.00    Average packs/day: 1 pack/day for 30.0 years (30.0 ttl pk-yrs)  Types: Cigarettes    Start date: 07/08/1953    Quit date: 07/09/1983    Years since quitting: 41.2   Smokeless tobacco: Never  Vaping Use   Vaping status: Never Used  Substance and Sexual Activity   Alcohol use: Yes    Alcohol/week: 1.0 standard drink of alcohol    Types: 1 Glasses of wine per week    Comment: daily   Drug use: No   Sexual activity: Not on file  Other Topics  Concern   Not on file  Social History Narrative   ** Merged History Encounter **       Lives in [place].  Retired engineer, civil (consulting).  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   Social Drivers of Health   Tobacco Use: Medium Risk (09/19/2024)   Patient History    Smoking Tobacco Use: Former    Smokeless Tobacco Use: Never    Passive Exposure: Not on file  Financial Resource Strain: Low Risk  (03/24/2024)   Received from [facility]   Overall Financial Resource Strain (CARDIA)    Difficulty of Paying Living Expenses: Not hard at all  Food Insecurity: No Food Insecurity (09/20/2024)   Epic    Worried About Radiation Protection Practitioner of Food in the Last Year: Never true    Ran Out of Food in the Last Year: Never true  Transportation Needs: No Transportation Needs (09/20/2024)   Epic    Lack of Transportation (Medical): No    Lack of Transportation (Non-Medical): No  Physical Activity: Not on file  Stress: Not on file  Social Connections: Moderately Integrated (09/20/2024)   Social Connection and Isolation Panel    Frequency of Communication with Friends and Family: Three times a week    Frequency of Social Gatherings with Friends and Family: Three times a week    Attends Religious Services: Patient declined    Active Member of Clubs or Organizations: Patient unable to answer    Attends Club or Organization Meetings: 1 to 4 times per year    Marital Status: Married  Depression (PHQ2-9): Low Risk (08/19/2024)   Depression (PHQ2-9)    PHQ-2 Score: 0  Alcohol Screen: Not on file  Housing: Low Risk (09/20/2024)   Epic    Unable to Pay for Housing in the Last Year: No    Number of Times Moved in the Last Year: 0    Homeless in the Last Year: No  Utilities: Not At Risk (09/20/2024)   Epic    Threatened with loss of utilities: No  Health Literacy: Not on file   Family History  Problem Relation Age of Onset   Diabetes Mother    Stroke Father 95    Allergies[1] Prior to Admission medications  Medication Sig Start Date End Date Taking? Authorizing Provider  apixaban  (ELIQUIS ) 5 MG TABS tablet Take 5 mg by mouth 2 (two) times daily.  07/02/16  Yes [provider]  Coenzyme Q10 (CO Q-10) 300 MG CAPS Take 300 mg by mouth daily.   Yes [provider]  furosemide  (LASIX ) 20 MG tablet Take 20 mg by mouth. 08/07/24 08/07/25 Yes [provider]  metoprolol  succinate (TOPROL -XL) 25 MG 24 hr tablet Take 1 tablet by mouth 3 (three) times daily. I tab in the AM, and 2 at bedtime per pt. 01/17/23  Yes [provider]  Multiple Vitamins-Minerals (ONE-A-DAY WOMENS 50+ ADVANTAGE PO) Take 1 tablet by mouth daily.   Yes [provider]  omeprazole  (  PRILOSEC) 20 MG capsule Take 1 capsule (20 mg total) by mouth daily. 01/22/24  Yes Gretel App, NP  amoxicillin -clavulanate (AUGMENTIN ) 875-125 MG tablet Take 1 tablet by mouth 2 (two) times daily for 7 days. Patient not taking: Reported on 09/19/2024 09/16/24 09/23/24  Poggi, Jenna E, PA-C  Cholecalciferol (EQL VITAMIN D3) 2000 units CAPS Take 2,000 Units by mouth daily.    [provider]  clobetasol  (TEMOVATE ) 0.05 % external solution as needed. Patient not taking: Reported on 09/19/2024 01/28/18   [provider]  magnesium oxide (MAG-OX) 400 MG tablet Take 400 mg by mouth daily. Patient not taking: Reported on 09/19/2024    [provider]   Recent Labs    09/19/24 1701 09/20/24 0545  WBC 4.9 4.0  HGB 13.3 12.6  HCT 39.6 37.3  PLT 184 176  K 4.3 3.8  CL 104 106  CO2 26 26  BUN 18 16  CREATININE 0.82 0.75  GLUCOSE 91 87  CALCIUM 9.6 8.6*   MR HAND LEFT W WO CONTRAST Result Date: 09/20/2024 CLINICAL DATA:  Left hand infection after being scratched by CT EXAM: MRI OF THE LEFT HAND WITHOUT AND WITH CONTRAST TECHNIQUE: Multiplanar, multisequence MR imaging of the left hand was performed before and after the administration of  intravenous contrast. CONTRAST:  6mL GADAVIST  GADOBUTROL  1 MMOL/ML IV SOLN COMPARISON:  None Available. FINDINGS: Bones/Joint/Cartilage No marrow signal abnormality. No fracture or dislocation. Normal alignment. No joint effusion. No erosive changes. No periostitis. Mild osteoarthritis of the first CMC joint. Ligaments Collateral ligaments are intact. Muscles and Tendons Flexor and extensor compartment tendons are intact. Edema in the dorsal interosseous muscle between the first and second metacarpals. Soft tissue No fluid collection or hematoma. No soft tissue mass. Soft tissue edema along the dorsal aspect of the hand and volar radial aspect of the hand concerning for cellulitis. IMPRESSION: 1. Soft tissue edema along the dorsal aspect of the hand and volar radial aspect of the hand concerning for cellulitis. Edema in the dorsal interosseous muscle between the first and second metacarpals concerning for myositis. No drainable fluid collection to suggest an abscess. 2. No evidence of osteomyelitis of the left hand. Electronically Signed   By: Julaine Blanch M.D.   On: 09/20/2024 08:28     Positive ROS: All other systems have been reviewed and were otherwise negative with the exception of those mentioned in the HPI and as above.  Physical Exam: BP (!) 150/79 (BP Location: Right Arm)   Pulse 74   Temp 97.6 F (36.4 C)   Resp 16   Ht 5' 3 (1.6 m)   Wt 64.9 kg   SpO2 96%   BMI 25.35 kg/m  General:  Alert, no acute distress Psychiatric:  Patient is competent for consent with normal mood and affect    Orthopedic Exam:  LUE: +ain/pin/u motor SILT r/u/m/ax +rad pulse Relatively normal range of motion to flexion and extension of thumb, index finger, and wrist There is an approximately 5 cm dorsum hand laceration with nylon sutures spanning from the region of the base of the second MCP to the first MCP.  There appears to be a region of dried drainage about the ulnar aspect of the laceration.  There  is significant tenderness to palpation about the laceration There is no discrete erythema about the forearm or dorsum of the hand.  There appears to be some reddish/purple discoloration about the laceration.  There is also a small region of blistering a few  centimeters distal to the laceration overlying the proximal phalanx of the thumb.  This region also has discoloration about the base. There is mild soft tissue swelling about the laceration.  No significant fusiform swelling, pain along flexor tendon sheaths No palpable fluid collection.   Imaging:  As above: No focal fluid collection.  Assessment/Plan: 83 y.o. female with left hand infection after getting scratched by her cat.  She appears to be improving on IV antibiotics.  There is no discrete focal fluid collection amenable to her surgical drainage. 1.  No current plan for surgical management given clinical exam and lack of specific imaging findings amenable to surgery. 2.  Continue IV antibiotics for additional 1-2 days.  Can be discharged on p.o. antibiotics. 3.  Patient may follow up with [facility] clinic orthopedics as an outpatient in approximately 1 week.  Will plan to follow peripherally.  Please call if any significant change in patient's exam or worsening of her condition.   Earnestine Blanch   09/20/2024 11:40 AM     [1] No Known Allergies

## 2024-09-20 NOTE — Plan of Care (Signed)
°  Problem: Education: Goal: Knowledge of General Education information will improve Description: Including pain rating scale, medication(s)/side effects and non-pharmacologic comfort measures Outcome: Progressing   Problem: Health Behavior/Discharge Planning: Goal: Ability to manage health-related needs will improve Outcome: Progressing   Problem: Clinical Measurements: Goal: Ability to maintain clinical measurements within normal limits will improve Outcome: Progressing Goal: Will remain free from infection Outcome: Progressing Goal: Diagnostic test results will improve Outcome: Progressing Goal: Respiratory complications will improve Outcome: Progressing Goal: Cardiovascular complication will be avoided Outcome: Progressing   Problem: Activity: Goal: Risk for activity intolerance will decrease Outcome: Progressing   Problem: Nutrition: Goal: Adequate nutrition will be maintained Outcome: Progressing   Problem: Coping: Goal: Level of anxiety will decrease Outcome: Progressing   Problem: Elimination: Goal: Will not experience complications related to bowel motility Outcome: Progressing Goal: Will not experience complications related to urinary retention Outcome: Progressing   Problem: Safety: Goal: Ability to remain free from injury will improve Outcome: Progressing   Problem: Pain Managment: Goal: General experience of comfort will improve and/or be controlled Outcome: Not Progressing   Problem: Skin Integrity: Goal: Risk for impaired skin integrity will decrease Outcome: Not Progressing

## 2024-09-20 NOTE — Progress Notes (Addendum)
 Progress Note   Patient: Cynthia Potts FMW:[id number] DOB: [birth year] DOA: 09/19/2024     0 DOS: the patient was seen and examined on 09/20/2024   Brief hospital course: Per H&P HPI:    MACKINZEE ROSZAK is a 83 y.o. year old female with medical history of chronic atrial fibrillation, osteoarthritis, GERD presenting to the ED after having a an infection from a cat bite that did not respond to oral antibiotics.   Patient reports her cat her about 3 days ago and she presented to the ED and was discharged with oral antibiotics after laceration repair was done.  Her hand has continued to be in pain and there has been some redness that has increased. She denies any fevers or chills.     On arrival to the ED patient was noted to be HDS stable.  Lab work obtained.  CBC without leukocytosis or anemia.  CMP unremarkable.  Lactic acid normal.  Given failure of outpatient antibiotics, IV antibiotics started by EDP and [facility] contacted for admission.    Assessment and Plan: Left upper extremity cellulitis in the setting of recent cat scratch  No leukocytosis.  MRI of patient's left hand showed no abscess, possible myositis.  Orthopedic surgery was consulted, appreciate assistance, they noted no need for surgical intervention at this time.  Patient received tetanus 12/10.  Patient states that her cat has received all vaccinations and low suspicion for rabies.  -- Continue IV antibiotics for 1 additional day and transition patient back to Augmentin   Chronic atrial fibrillation Continue patient's home metoprolol  and Eliquis   Elevated blood pressure On metop for a fib. This will need further monitoring outpatient.   GERD  Well controlled, patient not on medication for this.       Subjective: Patient states the redness on her left upper extremity is improved  Physical Exam: Vitals:   09/19/24 1908 09/19/24 2056 09/20/24 0501 09/20/24 0758  BP:  (!) 137/95 105/86 (!) 150/79  Pulse:   79 74   Resp:  16 16 16   Temp:  97.6 F (36.4 C) 97.9 F (36.6 C) 97.6 F (36.4 C)  TempSrc:  Oral Oral   SpO2: 95% 96% 99% 96%  Weight:   64.9 kg   Height:       Physical Exam  Constitutional: In no distress.  Cardiovascular: Irregularly irregular. No lower extremity edema  Pulmonary: Non labored breathing on room air, no wheezing or rales.   Abdominal: Soft. Non distended and non tender Musculoskeletal: Normal range of motion.     Neurological: Alert and oriented to person, place, and time. Non focal  Skin: Skin is warm and dry.       Data Reviewed:     Latest Ref Rng & Units 09/20/2024    5:45 AM 09/19/2024    5:01 PM 01/22/2024   10:27 AM  BMP  Glucose 70 - 99 mg/dL 87  91  97   BUN 8 - 23 mg/dL 16  18  19    Creatinine 0.44 - 1.00 mg/dL 9.24  9.17  9.05   Sodium 135 - 145 mmol/L 144  142  141   Potassium 3.5 - 5.1 mmol/L 3.8  4.3  4.5   Chloride 98 - 111 mmol/L 106  104  102   CO2 22 - 32 mmol/L 26  26  30    Calcium 8.9 - 10.3 mg/dL 8.6  9.6  9.3       Latest Ref Rng & Units 09/20/2024  5:45 AM 09/19/2024    5:01 PM 01/22/2024   10:27 AM  CBC  WBC 4.0 - 10.5 K/uL 4.0  4.9  5.5   Hemoglobin 12.0 - 15.0 g/dL 87.3  86.6  85.5   Hematocrit 36.0 - 46.0 % 37.3  39.6  42.9   Platelets 150 - 400 K/uL 176  184  195.0      Family Communication: Daughter at bedside  Disposition: Status is: Observation The patient remains OBS appropriate and will d/c before 2 midnights.  Planned Discharge Destination: Home    Time spent: 35 minutes  Author: Alban Pepper, MD 09/20/2024 2:10 PM  For on call review [email or web address].

## 2024-09-20 NOTE — Plan of Care (Signed)

## 2024-09-21 ENCOUNTER — Encounter

## 2024-09-21 LAB — CBC
HCT: 39.3 % (ref 36.0–46.0)
Hemoglobin: 13.3 g/dL (ref 12.0–15.0)
MCH: 32 pg (ref 26.0–34.0)
MCHC: 33.8 g/dL (ref 30.0–36.0)
MCV: 94.7 fL (ref 80.0–100.0)
Platelets: 194 K/uL (ref 150–400)
RBC: 4.15 MIL/uL (ref 3.87–5.11)
RDW: 12.9 % (ref 11.5–15.5)
WBC: 5.2 K/uL (ref 4.0–10.5)
nRBC: 0 % (ref 0.0–0.2)

## 2024-09-21 LAB — BASIC METABOLIC PANEL WITH GFR
Anion gap: 11 (ref 5–15)
BUN: 15 mg/dL (ref 8–23)
CO2: 27 mmol/L (ref 22–32)
Calcium: 8.8 mg/dL — ABNORMAL LOW (ref 8.9–10.3)
Chloride: 105 mmol/L (ref 98–111)
Creatinine, Ser: 0.88 mg/dL (ref 0.44–1.00)
GFR, Estimated: >60 mL/min (ref >60)
Glucose, Bld: 95 mg/dL (ref 70–99)
Potassium: 3.6 mmol/L (ref 3.5–5.1)
Sodium: 142 mmol/L (ref 135–145)

## 2024-09-21 LAB — GLUCOSE, CAPILLARY: Glucose-Capillary: 89 mg/dL (ref 70–99)

## 2024-09-21 MED ORDER — ACETAMINOPHEN 325 MG PO TABS: 650.0000 mg | ORAL_TABLET | Freq: Four times a day (QID) | ORAL | Status: AC | PRN

## 2024-09-21 MED ORDER — AMOXICILLIN-POT CLAVULANATE 875-125 MG PO TABS: 1.0000 | ORAL_TABLET | Freq: Two times a day (BID) | ORAL | 0 refills | Status: AC

## 2024-09-21 NOTE — Discharge Instructions (Signed)
 Please follow up with your orthopedic surgery team in 1 week.   Plese take your antibiotics as prescribed. If you develop a fever or notice worsening of your wounds please come back to the hospital.

## 2024-09-21 NOTE — Plan of Care (Signed)

## 2024-09-22 ENCOUNTER — Telehealth: Payer: Self-pay

## 2024-09-22 NOTE — Discharge Summary (Signed)
 Physician Discharge Summary   Patient: Cynthia Potts MRN: [id number] DOB: 14-Apr-1941  Admit date:     09/19/2024  Discharge date: 09/21/2024  Discharge Physician: Alban Pepper   PCP: Gretel App, NP   Recommendations at discharge:    Monitoring of blood pressure outpatient  Discharge Diagnoses: Principal Problem:   Cellulitis of hand, left Active Problems:   Chronic atrial fibrillation (HCC)   Osteoarthritis of both knees   Gastroesophageal reflux disease   Cellulitis of hand, right  Resolved Problems:   * No resolved hospital problems. *  Hospital Course: Per H&P HPI:    Cynthia Potts is a 83 y.o. year old female with medical history of chronic atrial fibrillation, osteoarthritis, GERD presenting to the ED after having a an infection from a cat bite that did not respond to oral antibiotics.   Patient reports her cat her about 3 days ago and she presented to the ED and was discharged with oral antibiotics after laceration repair was done.  Her hand has continued to be in pain and there has been some redness that has increased. She denies any fevers or chills.     On arrival to the ED patient was noted to be HDS stable.  Lab work obtained.  CBC without leukocytosis or anemia.  CMP unremarkable.  Lactic acid normal.  Given failure of outpatient antibiotics, IV antibiotics started by EDP and [facility] contacted for admission.  Assessment and Plan: Left upper extremity cellulitis in the setting of recent cat scratch   Afebrile, HDS, No leukocytosis throughout hospitalization.  MRI of patient's left hand showed no abscess, possible myositis.  Orthopedic surgery was consulted, appreciate assistance, they noted no need for surgical intervention at this time.  Patient received tetanus 12/10.  Patient states that her cat has received all vaccinations and low suspicion for rabies. She received 5 doses of unasyn  and had improvement in her cellullitis. She was discharged on augmentin .  She will have follow up with her PCP and orthopedic surgery.      Chronic atrial fibrillation Patient was continued on her home metoprolol  and Eliquis    Elevated blood pressure On metop for a fib. This will need further monitoring outpatient.    GERD  Well controlled, patient not on medication for this.         Consultants: Ortho Procedures performed: None  Disposition: Home Diet recommendation:  Regular diet DISCHARGE MEDICATION: Allergies as of 09/21/2024   No Known Allergies      Medication List     TAKE these medications    acetaminophen  325 MG tablet Commonly known as: TYLENOL  Take 2 tablets (650 mg total) by mouth every 6 (six) hours as needed for mild pain (pain score 1-3) or fever (or Fever >/= 101).   amoxicillin -clavulanate 875-125 MG tablet Commonly known as: AUGMENTIN  Take 1 tablet by mouth 2 (two) times daily for 14 doses. Start taking on: September 25, 2024 What changed: These instructions start on September 25, 2024. If you are unsure what to do until then, ask your doctor or other care provider.   apixaban  5 MG Tabs tablet Commonly known as: ELIQUIS  Take 5 mg by mouth 2 (two) times daily.   clobetasol  0.05 % external solution Commonly known as: TEMOVATE  as needed.   Co Q-10 300 MG Caps Take 300 mg by mouth daily.   EQL Vitamin D3 50 MCG (2000 UT) Caps Generic drug: Cholecalciferol Take 2,000 Units by mouth daily.   furosemide  20 MG tablet Commonly known  as: LASIX  Take 20 mg by mouth.   magnesium oxide 400 MG tablet Commonly known as: MAG-OX Take 400 mg by mouth daily.   metoprolol  succinate 25 MG 24 hr tablet Commonly known as: TOPROL -XL Take 1 tablet by mouth 3 (three) times daily. I tab in the AM, and 2 at bedtime per pt.   omeprazole  20 MG capsule Commonly known as: PRILOSEC Take 1 capsule (20 mg total) by mouth daily.   ONE-A-DAY WOMENS 50+ ADVANTAGE PO Take 1 tablet by mouth daily.        Follow-up Information      [facility] clinic orthopedics. Schedule an appointment as soon as possible for a visit in 1 week(s).   Contact information: [phone]  [address] [place], [place] [address]        Gretel App, NP. Schedule an appointment as soon as possible for a visit in 1 week(s).   Specialty: Nurse Practitioner Contact information: [address] [place] [place] [address] [phone]                Discharge Exam: Filed Weights   09/19/24 1657 09/20/24 0501 09/21/24 0701  Weight: 63.5 kg 64.9 kg 65 kg   Physical Exam  Constitutional: In no distress.  Cardiovascular: irregularly irregular. No lower extremity edema  Pulmonary: Non labored breathing on room air, no wheezing or rales.   Abdominal: Soft. Non distended and non tender Neurological: Alert and oriented to person, place, and time. Non focal  Skin: Skin is warm and dry.      Condition at discharge: improving  The results of significant diagnostics from this hospitalization (including imaging, microbiology, ancillary and laboratory) are listed below for reference.   Imaging Studies: MR HAND LEFT W WO CONTRAST Result Date: 09/20/2024 CLINICAL DATA:  Left hand infection after being scratched by CT EXAM: MRI OF THE LEFT HAND WITHOUT AND WITH CONTRAST TECHNIQUE: Multiplanar, multisequence MR imaging of the left hand was performed before and after the administration of intravenous contrast. CONTRAST:  6mL GADAVIST  GADOBUTROL  1 MMOL/ML IV SOLN COMPARISON:  None Available. FINDINGS: Bones/Joint/Cartilage No marrow signal abnormality. No fracture or dislocation. Normal alignment. No joint effusion. No erosive changes. No periostitis. Mild osteoarthritis of the first CMC joint. Ligaments Collateral ligaments are intact. Muscles and Tendons Flexor and extensor compartment tendons are intact. Edema in the dorsal interosseous muscle between the first and second metacarpals. Soft tissue No fluid collection or hematoma. No  soft tissue mass. Soft tissue edema along the dorsal aspect of the hand and volar radial aspect of the hand concerning for cellulitis. IMPRESSION: 1. Soft tissue edema along the dorsal aspect of the hand and volar radial aspect of the hand concerning for cellulitis. Edema in the dorsal interosseous muscle between the first and second metacarpals concerning for myositis. No drainable fluid collection to suggest an abscess. 2. No evidence of osteomyelitis of the left hand. Electronically Signed   By: Julaine Blanch M.D.   On: 09/20/2024 08:28    Microbiology: Results for orders placed or performed during the hospital encounter of 09/19/24  Culture, blood (Routine X 2) w Reflex to ID Panel     Status: None (Preliminary result)   Collection Time: 09/19/24  9:58 PM   Specimen: BLOOD  Result Value Ref Range Status   Specimen Description BLOOD BLOOD RIGHT ARM  Final   Special Requests   Final    BOTTLES DRAWN AEROBIC AND ANAEROBIC Blood Culture adequate volume   Culture   Final  NO GROWTH 2 DAYS Performed at [facility], [address] [place], [place] [address]    Report Status PENDING  Incomplete  Culture, blood (Routine X 2) w Reflex to ID Panel     Status: None (Preliminary result)   Collection Time: 09/19/24 10:03 PM   Specimen: BLOOD  Result Value Ref Range Status   Specimen Description BLOOD BLOOD LEFT ARM  Final   Special Requests   Final    BOTTLES DRAWN AEROBIC AND ANAEROBIC Blood Culture results may not be optimal due to an excessive volume of blood received in culture bottles   Culture   Final    NO GROWTH 2 DAYS Performed at [facility], [address] [place], [place] [address]    Report Status PENDING  Incomplete    Labs: CBC: Recent Labs  Lab 09/19/24 1701 09/20/24 0545 09/21/24 0536  WBC 4.9 4.0 5.2  HGB 13.3 12.6 13.3  HCT 39.6 37.3 39.3  MCV 96.1 95.2 94.7  PLT 184 176 194   Basic Metabolic Panel: Recent Labs  Lab 09/19/24 1701  09/20/24 0545 09/21/24 0536  NA 142 144 142  K 4.3 3.8 3.6  CL 104 106 105  CO2 26 26 27   GLUCOSE 91 87 95  BUN 18 16 15   CREATININE 0.82 0.75 0.88  CALCIUM 9.6 8.6* 8.8*  MG 2.0  --   --    Liver Function Tests: Recent Labs  Lab 09/19/24 1701  AST 22  ALT 13  ALKPHOS 81  BILITOT 0.9  PROT 7.0  ALBUMIN 4.4   CBG: Recent Labs  Lab 09/21/24 0724  GLUCAP 89    Discharge time spent: greater than 30 minutes.  Signed: Alban Pepper, MD [facility] 09/22/2024

## 2024-09-22 NOTE — Transitions of Care (Post Inpatient/ED Visit) (Signed)
 09/22/2024  Name: Cynthia Potts MRN: [id number] DOB: 07/21/1941  Today's TOC FU Call Status: Today's TOC FU Call Status:: Successful TOC FU Call Completed TOC FU Call Complete Date: 09/22/24  Patient's Name and Date of Birth confirmed. Name, DOB  Transition Care Management Follow-up Telephone Call Date of Discharge: 09/21/24 Discharge Facility: [facility]) Type of Discharge: Inpatient Admission Primary Inpatient Discharge Diagnosis:: cellulitis How have you been since you were released from the hospital?: Better Any questions or concerns?: No  Items Reviewed: Did you receive and understand the discharge instructions provided?: Yes Medications obtained,verified, and reconciled?: Yes (Medications Reviewed) Any new allergies since your discharge?: No Dietary orders reviewed?: Yes Do you have support at home?: Yes People in Home [RPT]: spouse  Medications Reviewed Today: Medications Reviewed Today     Reviewed by Emmitt Pan, LPN (Licensed Practical Nurse) on 09/22/24 at [phone]  Med List Status: <None>   Medication Order Taking? Sig Documenting Provider Last Dose Status Informant  acetaminophen  (TYLENOL ) 325 MG tablet [id number] Yes Take 2 tablets (650 mg total) by mouth every 6 (six) hours as needed for mild pain (pain score 1-3) or fever (or Fever >/= 101). Franchot Novel, MD  Active   amoxicillin -clavulanate (AUGMENTIN ) 875-125 MG tablet [id number] Yes Take 1 tablet by mouth 2 (two) times daily for 14 doses. Franchot Novel, MD  Active   apixaban  (ELIQUIS ) 5 MG TABS tablet [id number] Yes Take 5 mg by mouth 2 (two) times daily.  [provider]  Active Self           Med Note FLORIE, JENNIFER   Fri Apr 19, 2017  9:02 AM)    Cholecalciferol (EQL VITAMIN D3) 2000 units CAPS [id number] Yes Take 2,000 Units by mouth daily. [provider]  Active Self  clobetasol  (TEMOVATE ) 0.05 % external solution [id number]  as needed.   Patient not taking: Reported on 09/22/2024   [provider]  Active Self           Med Note (PHILLIPS, LATAVIA   Wed Jan 22, 2024 10:08 AM) PRN  Coenzyme Q10 (CO Q-10) 300 MG CAPS [id number] Yes Take 300 mg by mouth daily. [provider]  Active Self  furosemide  (LASIX ) 20 MG tablet [id number] Yes Take 20 mg by mouth. [provider]  Active Self           Med Note NIKKI, MARIA   Sat Sep 19, 2024  6:38 PM) prn  magnesium oxide (MAG-OX) 400 MG tablet [id number]  Take 400 mg by mouth daily.  Patient not taking: Reported on 09/22/2024   [provider]  Active Self  metoprolol  succinate (TOPROL -XL) 25 MG 24 hr tablet [id number] Yes Take 1 tablet by mouth 3 (three) times daily. I tab in the AM, and 2 at bedtime per pt. [provider]  Active Self  Multiple Vitamins-Minerals (ONE-A-DAY WOMENS 50+ ADVANTAGE PO) [id number] Yes Take 1 tablet by mouth daily. [provider]  Active Self  omeprazole  (PRILOSEC) 20 MG capsule [id number] Yes Take 1 capsule (20 mg total) by mouth daily. Gretel App, NP  Active Self            Home Care and Equipment/Supplies: Were Home Health Services Ordered?: NA Any new equipment or medical supplies ordered?: NA  Functional Questionnaire: Do you need assistance with bathing/showering or dressing?: No Do you need assistance with meal preparation?: No Do you need assistance with eating?: No Do you have difficulty maintaining continence: No Do  you need assistance with getting out of bed/getting out of a chair/moving?: No Do you have difficulty managing or taking your medications?: No  Follow up appointments reviewed: PCP Follow-up appointment confirmed?: No (sent message to staff to schedule) MD Provider Line Number:[phone] Given: No Specialist Hospital Follow-up appointment confirmed?: Yes Date of Specialist follow-up appointment?: 09/23/24 Follow-Up Specialty Provider:: ortho Do you need  transportation to your follow-up appointment?: No Do you understand care options if your condition(s) worsen?: Yes-patient verbalized understanding    SIGNATURE Julian Lemmings, LPN [facility] Nurse Health Advisor Direct Dial [phone]

## 2024-09-23 ENCOUNTER — Encounter

## 2024-09-23 ENCOUNTER — Ambulatory Visit: Admitting: Family Medicine

## 2024-09-24 LAB — CULTURE, BLOOD (ROUTINE X 2)
Culture: NO GROWTH
Culture: NO GROWTH
Special Requests: ADEQUATE

## 2024-09-28 ENCOUNTER — Encounter

## 2024-10-05 ENCOUNTER — Encounter

## 2024-10-23 ENCOUNTER — Encounter: Payer: Self-pay | Admitting: Nurse Practitioner

## 2024-10-23 ENCOUNTER — Ambulatory Visit: Admitting: Nurse Practitioner

## 2024-10-23 VITALS — BP 116/68 | HR 79 | Temp 97.5°F | Ht 63.0 in | Wt 141.8 lb

## 2024-10-23 DIAGNOSIS — E559 Vitamin D deficiency, unspecified: Secondary | ICD-10-CM

## 2024-10-23 DIAGNOSIS — S60512D Abrasion of left hand, subsequent encounter: Secondary | ICD-10-CM

## 2024-10-23 DIAGNOSIS — L089 Local infection of the skin and subcutaneous tissue, unspecified: Secondary | ICD-10-CM

## 2024-10-23 DIAGNOSIS — W5503XD Scratched by cat, subsequent encounter: Secondary | ICD-10-CM

## 2024-10-23 DIAGNOSIS — Z780 Asymptomatic menopausal state: Secondary | ICD-10-CM | POA: Diagnosis not present

## 2024-10-23 NOTE — Assessment & Plan Note (Signed)
 Discussed the importance of bone density screening and reviewed treatment options, including Prolia, Fosamax, and Reclast. She is considering the scan and treatment. Continue vitamin D  supplementation. Consider a bone density scan.

## 2024-10-23 NOTE — Progress Notes (Signed)
 " Leron Glance, NP-C Phone: [phone]  CARIGAN LISTER is a 84 y.o. female who presents today for ED follow up.   Discussed the use of AI scribe software for clinical note transcription with the patient, who gave verbal consent to proceed.  History of Present Illness   DIANELY KREHBIEL is an 84 year old female who presents for a hospital follow-up after treatment for an infected cat scratch.  She was admitted to the hospital on December 13th due to an infected cat scratch, which resulted in significant redness and swelling of the arm. Initial treatment with oral antibiotics was ineffective, leading to the administration of IV antibiotics and the placement of six stitches at the scratch site. Her hospitalization lasted from Saturday to Monday.  Post-discharge, she attended follow-up appointments with orthopedics on December 17th and December 22nd for wound checks and stitch removal. She completed her course of oral antibiotics, and the wound has healed without pain. She reports that she expects to have some scar tissue formation.  During the review of systems, she reports no ongoing pain or issues related to the previous infection.      Tobacco Use History[1]  Medications Ordered Prior to Encounter[2]   ROS see history of present illness  Objective  Physical Exam Vitals:   10/23/24 1057  BP: 116/68  Pulse: 79  Temp: (!) 97.5 F (36.4 C)  SpO2: 98%    BP Readings from Last 3 Encounters:  10/23/24 116/68  09/21/24 (!) 143/79  09/19/24 139/85   Wt Readings from Last 3 Encounters:  10/23/24 141 lb 12.8 oz (64.3 kg)  09/21/24 143 lb 4.8 oz (65 kg)  09/16/24 140 lb (63.5 kg)    Physical Exam Constitutional:      General: She is not in acute distress.    Appearance: Normal appearance.  HENT:     Head: Normocephalic.  Cardiovascular:     Rate and Rhythm: Normal rate and regular rhythm.     Heart sounds: Normal heart sounds.  Pulmonary:     Effort: Pulmonary effort  is normal.     Breath sounds: Normal breath sounds.  Skin:    General: Skin is warm and dry.  Neurological:     General: No focal deficit present.     Mental Status: She is alert.  Psychiatric:        Mood and Affect: Mood normal.        Behavior: Behavior normal.      Assessment/Plan: Please see individual problem list.  Infected cat scratch of hand, left, subsequent encounter Assessment & Plan: Hospital notes, labs, imaging and medications reviewed. Completed oral antibiotics. Area well healed. She has had 2 follow ups since discharge with Ortho, stitches removed. Scheduled for additional follow up on January 26. No further work up needed at this time. Follow up as scheduled.    Postmenopausal estrogen deficiency Assessment & Plan: Discussed the importance of bone density screening and reviewed treatment options, including Prolia, Fosamax, and Reclast. She is considering the scan and treatment. Continue vitamin D  supplementation. Consider a bone density scan.    Vitamin D  deficiency Assessment & Plan: Continue OTC daily supplementation.      Return for as needed.   Leron Glance, NP-C [facility] [place]     [1]  Social History Tobacco Use  Smoking Status Former   Current packs/day: 0.00   Average packs/day: 1 pack/day for 30.0 years (30.0 ttl pk-yrs)   Types: Cigarettes  Start date: 07/08/1953   Quit date: 07/09/1983   Years since quitting: 41.3  Smokeless Tobacco Never  [2]  Current Outpatient Medications on File Prior to Visit  Medication Sig Dispense Refill   acetaminophen  (TYLENOL ) 325 MG tablet Take 2 tablets (650 mg total) by mouth every 6 (six) hours as needed for mild pain (pain score 1-3) or fever (or Fever >/= 101).     apixaban  (ELIQUIS ) 5 MG TABS tablet Take 5 mg by mouth 2 (two) times daily.      Cholecalciferol (EQL VITAMIN D3) 2000 units CAPS Take 2,000 Units by mouth daily.     Coenzyme Q10 (CO Q-10) 300 MG CAPS Take 300  mg by mouth daily.     magnesium oxide (MAG-OX) 400 MG tablet Take 400 mg by mouth daily.     metoprolol  succinate (TOPROL -XL) 25 MG 24 hr tablet Take 1 tablet by mouth 3 (three) times daily. I tab in the AM, and 2 at bedtime per pt.     Multiple Vitamins-Minerals (ONE-A-DAY WOMENS 50+ ADVANTAGE PO) Take 1 tablet by mouth daily.     omeprazole  (PRILOSEC) 20 MG capsule Take 1 capsule (20 mg total) by mouth daily. 90 capsule 3   furosemide  (LASIX ) 20 MG tablet Take 20 mg by mouth. (Patient not taking: Reported on 10/23/2024)     No current facility-administered medications on file prior to visit.   "

## 2024-10-23 NOTE — Assessment & Plan Note (Signed)
 Hospital notes, labs, imaging and medications reviewed. Completed oral antibiotics. Area well healed. She has had 2 follow ups since discharge with Ortho, stitches removed. Scheduled for additional follow up on January 26. No further work up needed at this time. Follow up as scheduled.

## 2024-10-23 NOTE — Assessment & Plan Note (Signed)
 Continue OTC daily supplementation.
